# Patient Record
Sex: Female | Born: 1965
Health system: Southern US, Community
[De-identification: ages and names within clinical notes are randomized; demographics above are authoritative.]

## PROBLEM LIST (undated history)

## (undated) DIAGNOSIS — N842 Polyp of vagina: Secondary | ICD-10-CM

## (undated) DIAGNOSIS — M899 Disorder of bone, unspecified: Secondary | ICD-10-CM

## (undated) DIAGNOSIS — N952 Postmenopausal atrophic vaginitis: Secondary | ICD-10-CM

## (undated) DIAGNOSIS — N951 Menopausal and female climacteric states: Secondary | ICD-10-CM

## (undated) DIAGNOSIS — E7800 Pure hypercholesterolemia, unspecified: Secondary | ICD-10-CM

## (undated) DIAGNOSIS — M949 Disorder of cartilage, unspecified: Secondary | ICD-10-CM

## (undated) HISTORY — DX: Disorder of bone, unspecified: M89.9

## (undated) HISTORY — DX: Menopausal and female climacteric states: N95.1

## (undated) HISTORY — PX: OTHER SURGICAL HISTORY: SHX169

## (undated) HISTORY — DX: Pure hypercholesterolemia, unspecified: E78.00

## (undated) HISTORY — DX: Polyp of vagina: N84.2

## (undated) HISTORY — DX: Disorder of cartilage, unspecified: M94.9

## (undated) HISTORY — DX: Postmenopausal atrophic vaginitis: N95.2

---

## 2006-01-01 ENCOUNTER — Encounter: Payer: Self-pay | Admitting: Family Medicine

## 2006-11-11 LAB — CONVERTED CEMR LAB: Pap Smear: NORMAL

## 2007-12-02 ENCOUNTER — Telehealth: Payer: Self-pay | Admitting: Family Medicine

## 2007-12-02 ENCOUNTER — Ambulatory Visit: Payer: Self-pay | Admitting: Family Medicine

## 2007-12-02 DIAGNOSIS — M8580 Other specified disorders of bone density and structure, unspecified site: Secondary | ICD-10-CM

## 2007-12-02 DIAGNOSIS — N842 Polyp of vagina: Secondary | ICD-10-CM

## 2007-12-02 DIAGNOSIS — E7800 Pure hypercholesterolemia, unspecified: Secondary | ICD-10-CM

## 2007-12-02 DIAGNOSIS — N952 Postmenopausal atrophic vaginitis: Secondary | ICD-10-CM

## 2007-12-02 DIAGNOSIS — N951 Menopausal and female climacteric states: Secondary | ICD-10-CM | POA: Insufficient documentation

## 2007-12-09 ENCOUNTER — Ambulatory Visit: Payer: Self-pay | Admitting: Family Medicine

## 2007-12-12 ENCOUNTER — Encounter: Payer: Self-pay | Admitting: Family Medicine

## 2007-12-12 ENCOUNTER — Other Ambulatory Visit: Admission: RE | Admit: 2007-12-12 | Discharge: 2007-12-12 | Payer: Self-pay | Admitting: Family Medicine

## 2007-12-12 ENCOUNTER — Ambulatory Visit: Payer: Self-pay | Admitting: Family Medicine

## 2007-12-12 LAB — CONVERTED CEMR LAB
Alkaline Phosphatase: 30 units/L — ABNORMAL LOW (ref 39–117)
BUN: 23 mg/dL (ref 6–23)
Bilirubin, Direct: 0.1 mg/dL (ref 0.0–0.3)
Chloride: 104 meq/L (ref 96–112)
GFR calc Af Amer: 88 mL/min
GFR calc non Af Amer: 73 mL/min
Glucose, Bld: 87 mg/dL (ref 70–99)
HDL: 77.7 mg/dL (ref >39.0)
Total Bilirubin: 0.7 mg/dL (ref 0.3–1.2)
Total CHOL/HDL Ratio: 3.1

## 2007-12-15 ENCOUNTER — Encounter (INDEPENDENT_AMBULATORY_CARE_PROVIDER_SITE_OTHER): Payer: Self-pay | Admitting: *Deleted

## 2007-12-17 ENCOUNTER — Encounter (INDEPENDENT_AMBULATORY_CARE_PROVIDER_SITE_OTHER): Payer: Self-pay | Admitting: *Deleted

## 2008-01-06 ENCOUNTER — Encounter: Admission: RE | Admit: 2008-01-06 | Discharge: 2008-01-06 | Payer: Self-pay | Admitting: Family Medicine

## 2008-01-06 ENCOUNTER — Encounter: Payer: Self-pay | Admitting: Family Medicine

## 2008-01-14 ENCOUNTER — Encounter: Admission: RE | Admit: 2008-01-14 | Discharge: 2008-01-14 | Payer: Self-pay | Admitting: Family Medicine

## 2008-01-14 ENCOUNTER — Encounter (INDEPENDENT_AMBULATORY_CARE_PROVIDER_SITE_OTHER): Payer: Self-pay | Admitting: *Deleted

## 2008-03-30 ENCOUNTER — Ambulatory Visit: Payer: Self-pay | Admitting: Family Medicine

## 2008-10-18 ENCOUNTER — Telehealth: Payer: Self-pay | Admitting: Family Medicine

## 2009-03-04 ENCOUNTER — Other Ambulatory Visit: Admission: RE | Admit: 2009-03-04 | Discharge: 2009-03-04 | Payer: Self-pay | Admitting: Family Medicine

## 2009-03-04 ENCOUNTER — Ambulatory Visit: Payer: Self-pay | Admitting: Family Medicine

## 2009-03-08 LAB — CONVERTED CEMR LAB: Pap Smear: NEGATIVE

## 2009-03-09 ENCOUNTER — Encounter: Payer: Self-pay | Admitting: Family Medicine

## 2009-03-15 ENCOUNTER — Encounter: Admission: RE | Admit: 2009-03-15 | Discharge: 2009-03-15 | Payer: Self-pay | Admitting: Family Medicine

## 2009-03-17 ENCOUNTER — Ambulatory Visit: Payer: Self-pay | Admitting: Family Medicine

## 2009-03-21 ENCOUNTER — Encounter (INDEPENDENT_AMBULATORY_CARE_PROVIDER_SITE_OTHER): Payer: Self-pay | Admitting: *Deleted

## 2009-03-23 LAB — CONVERTED CEMR LAB
ALT: 22 units/L (ref 0–35)
Alkaline Phosphatase: 34 units/L — ABNORMAL LOW (ref 39–117)
Bilirubin, Direct: 0.1 mg/dL (ref 0.0–0.3)
CO2: 30 meq/L (ref 19–32)
Calcium: 9.5 mg/dL (ref 8.4–10.5)
Chloride: 106 meq/L (ref 96–112)
Creatinine, Ser: 0.8 mg/dL (ref 0.4–1.2)
GFR calc non Af Amer: 82.76 mL/min (ref >60)
Glucose, Bld: 90 mg/dL (ref 70–99)
HDL: 109.6 mg/dL (ref >39.00)
Potassium: 3.9 meq/L (ref 3.5–5.1)
Total Bilirubin: 0.6 mg/dL (ref 0.3–1.2)
Total Protein: 7.4 g/dL (ref 6.0–8.3)
Triglycerides: 69 mg/dL (ref 0.0–149.0)

## 2009-09-02 ENCOUNTER — Ambulatory Visit: Payer: Self-pay | Admitting: Family Medicine

## 2009-09-06 LAB — CONVERTED CEMR LAB
HDL: 83.3 mg/dL (ref >39.00)
Total CHOL/HDL Ratio: 3
Triglycerides: 96 mg/dL (ref 0.0–149.0)
VLDL: 19.2 mg/dL (ref 0.0–40.0)

## 2010-02-17 ENCOUNTER — Telehealth: Payer: Self-pay | Admitting: Family Medicine

## 2010-02-19 ENCOUNTER — Encounter: Payer: Self-pay | Admitting: Family Medicine

## 2010-02-28 NOTE — Assessment & Plan Note (Signed)
Summary: CPX/CLE   Vital Signs:  Patient profile:   45 year old female Weight:      122.25 pounds BMI:     20.90 Temp:     98.3 degrees F oral Pulse rate:   64 / minute Pulse rhythm:   regular BP sitting:   100 / 70  (left arm) Cuff size:   regular  Vitals Entered By: Linde Gillis CMA Duncan Dull) (March 04, 2009 11:26 AM) CC: 30 minute exam   History of Present Illness: No breast lesions, no vaginal discharge/itching Doing well no acute concerns  Has not been exercsiing as much in alst 2 months...so weight gain.  Problems Prior to Update: 1)  Oth General Medical Examination Admin Purposes  (ICD-V70.3) 2)  Mammogram, Abnormal, Right  (ICD-793.80) 3)  Routine Gynecological Examination  (ICD-V72.31) 4)  Well Woman  (ICD-V70.0) 5)  Other Screening Mammogram  (ICD-V76.12) 6)  Hypercholesterolemia  (ICD-272.0) 7)  Osteopenia  (ICD-733.90) 8)  Vaginal Polyp  (ICD-623.7) 9)  Vaginitis, Atrophic  (ICD-627.3) 10)  Menopause, Early  (ICD-627.2)  Current Medications (verified): 1)  Alendronate Sodium 70 Mg Tabs (Alendronate Sodium) .... Take One Tablet By Mouth Each Week. 2)  Premarin 0.625 Mg/gm Crea (Estrogens, Conjugated) .... Take 1 Gram Applicatorful Per Vagina 2-3 Times Per Week. 3)  Eql Allergy Relief 10 Mg Tbdp (Loratadine) .... Take 1 Tablet By Mouth Once A Day As Needed 4)  Multivitamins   Tabs (Multiple Vitamin) .... Take 1 Tablet By Mouth Once A Day 5)  Calcium Carbonate-Vitamin D 600-400 Mg-Unit  Tabs (Calcium Carbonate-Vitamin D) .... Take 1 Tablet By Mouth Two Times A Day  Allergies: 1)  ! Erythromycin 2)  ! * Minocin  Past History:  Past medical, surgical, family and social histories (including risk factors) reviewed, and no changes noted (except as noted below).  Past Medical History: Reviewed history from 12/02/2007 and no changes required. Current Problems:  HYPERCHOLESTEROLEMIA (ICD-272.0) OSTEOPENIA (ICD-733.90) VAGINAL POLYP (ICD-623.7) VAGINITIS,  ATROPHIC (ICD-627.3) MENOPAUSE, EARLY (ICD-627.2)  Past Surgical History: Reviewed history from 12/02/2007 and no changes required. vaginal polyp removal NSVD x 2   Family History: Reviewed history from 12/02/2007 and no changes required. sister: breast cancer age 47 father: prostate cancer mother: lung cancer MGF: CAD, high cholesterol?  Social History: Reviewed history from 12/02/2007 and no changes required. 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 CV:  Denies chest pain or discomfort. Resp:  Denies shortness of breath. GI:  Denies abdominal pain. GU:  Denies abnormal vaginal bleeding and dysuria. Derm:  Denies lesion(s). Psych:  Denies anxiety and depression.  Physical Exam  General:  Well-developed,well-nourished,in no acute distress; alert,appropriate and cooperative throughout examination Eyes:  No corneal or conjunctival inflammation noted. EOMI. Perrla. Funduscopic exam benign, without hemorrhages, exudates or papilledema. Vision grossly normal. Ears:  External ear exam shows no significant lesions or deformities.  Otoscopic examination reveals clear canals, tympanic membranes are intact bilaterally without bulging, retraction, inflammation or discharge. Hearing is grossly normal bilaterally. Nose:  External nasal examination shows no deformity or inflammation. Nasal mucosa are pink and moist without lesions or exudates. Mouth:  Oral mucosa and oropharynx without lesions or  exudates.  Teeth in good repair. Abdomen:  Bowel sounds positive,abdomen soft and non-tender without masses, organomegaly or hernias noted. Genitalia:  Pelvic Exam:        External: normal  female genitalia without lesions or masses        Vagina: normal without lesions or masses        Cervix: normal without lesions or masses        Adnexa: normal bimanual exam without masses or fullness        Uterus: normal by palpation        Pap smear: performed Msk:  No deformity or scoliosis noted of thoracic or lumbar spine.   Pulses:  R and L posterior tibial pulses are full and equal bilaterally  Extremities:  no edema  Skin:  Intact without suspicious lesions or rashes Psych:  Cognition and judgment appear intact. Alert and cooperative with normal attention span and concentration. No apparent delusions, illusions, hallucinations   Impression & Recommendations:  Problem # 1:  WELL WOMAN (ICD-V70.0) Reviewed preventive care protocols, scheduled due services, and updated immunizations. Encouraged exercise, weight loss, healthy eating habits.   Problem # 2:  ROUTINE GYNECOLOGICAL EXAMINATION (ICD-V72.31) PAP pending.   Problem # 3:  OSTEOPENIA (ICD-733.90) next year due for DXA. Contrinue alendronate Her updated medication list for this problem includes:    Alendronate Sodium 70 Mg Tabs (Alendronate sodium) .Marland Kitchen... Take one tablet by mouth each week.    Calcium Carbonate-vitamin D 600-400 Mg-unit Tabs (Calcium carbonate-vitamin d) .Marland Kitchen... Take 1 tablet by mouth two times a day  Problem # 4:  MENOPAUSE, EARLY (ICD-627.2) Stable..vaginitis controlled on low dose premarin.  Her updated medication list for this problem includes:    Premarin 0.625 Mg/gm Crea (Estrogens, conjugated) .Marland Kitchen... Take 1 gram applicatorful per vagina 2-3 times per week.  Complete Medication List: 1)  Alendronate Sodium 70 Mg Tabs (Alendronate sodium) .... Take one tablet by mouth each week. 2)  Premarin 0.625 Mg/gm Crea (Estrogens, conjugated) .... Take 1 gram applicatorful per vagina 2-3 times per week. 3)  Eql Allergy Relief 10 Mg Tbdp (Loratadine) .... Take 1 tablet by mouth once a day as needed 4)   Multivitamins Tabs (Multiple vitamin) .... Take 1 tablet by mouth once a day 5)  Calcium Carbonate-vitamin D 600-400 Mg-unit Tabs (Calcium carbonate-vitamin d) .... Take 1 tablet by mouth two times a day  Other Orders: Radiology Referral (Radiology)  Patient Instructions: 1)  Fasting lipid, CMET Dx v77.91 2)  Referral Appointment Information 3)  Day/Date: 4)  Time: 5)  Place/MD: 6)  Address: 7)  Phone/Fax: 8)  Patient given appointment information. Information/Orders faxed/mailed.  Prescriptions: PREMARIN 0.625 MG/GM CREA (ESTROGENS, CONJUGATED) Take 1 gram applicatorful per vagina 2-3 times per week.  #43 Gram x 3   Entered and Authorized by:   Kerby Nora MD   Signed by:   Kerby Nora MD on 03/04/2009   Method used:   Electronically to        [facility] (retail)       [address]       [place], [place]  [address]       Ph: [phone]       Fax: [phone]   RxID:   [id number] ALENDRONATE SODIUM 70 MG TABS (ALENDRONATE SODIUM) Take one tablet by mouth each week.  #12 x 3   Entered and Authorized by:   Kerby Nora MD   Signed  by:   Kerby Nora MD on 03/04/2009   Method used:   Electronically to        [facility] (retail)       [address]       [place], [place]  [address]       Ph: [phone]       Fax: [phone]   RxID:   [id number]   Current Allergies (reviewed today): ! ERYTHROMYCIN ! * MINOCIN  Flu Vaccine Next Due:  Refused    Past Medical History:    Reviewed history from 12/02/2007 and no changes required:       Current Problems:        HYPERCHOLESTEROLEMIA (ICD-272.0)       OSTEOPENIA (ICD-733.90)       VAGINAL POLYP (ICD-623.7)       VAGINITIS, ATROPHIC (ICD-627.3)       MENOPAUSE, EARLY (ICD-627.2)         Past Surgical History:    Reviewed history from 12/02/2007 and no changes required:       vaginal polyp removal       NSVD x 2

## 2010-02-28 NOTE — Letter (Signed)
Summary: Results Follow up Letter  [place] [facility]  [address] [place], [place] [address]   Phone: [phone]  Fax: [phone]    03/09/2009 MRN: [id number]  [facility] [address] [place], [place]  [address]  Dear Ms. [facility],  The following are the results of your recent test(s):  Test         Result    Pap Smear:        Normal __X___  Not Normal _____ Comments: Repeat in one year. ______________________________________________________ Cholesterol: LDL(Bad cholesterol):         Your goal is less than:         HDL (Good cholesterol):       Your goal is more than: Comments:  ______________________________________________________ Mammogram:        Normal _____  Not Normal _____ Comments:  ___________________________________________________________________ Hemoccult:        Normal _____  Not normal _______ Comments:    _____________________________________________________________________ Other Tests:    We routinely do not discuss normal results over the telephone.  If you desire a copy of the results, or you have any questions about this information we can discuss them at your next office visit.   Sincerely,        Kerby Nora, MD

## 2010-02-28 NOTE — Letter (Signed)
Summary: Generic Letter  [place] at [facility]  [address] [place], [place] [address]   Phone: [phone]  Fax: [phone]    03/21/2009     [facility] [address] [place], [place]  [address]    Dear Ms. Corzine,  I have tried to reach you but could not leave a message because voicemail not set up. Dr. Ermalene Searing says Notify pt that bad chol is elevated LDL goal is at least less than 130, triglyerides are good and HDL is high which is good...mail info on diet, work on exerccise and weight loss. Recheck fasting lipids in 3 months Dx 272.0   ..if not at goal in that lab check consider medication to treat.    Sincerely,   Kerby Nora MD

## 2010-03-02 NOTE — Progress Notes (Signed)
Summary: vagefim, premarin  Phone Note Call from Patient Call back at Home Phone [phone]   Caller: Patient Call For: Kerby Nora MD Summary of Call: Patient received letter from insurance company stating tha vagefim is on the formulary list. She says that it will save her alot of money if she could use the vagefim instead of the premarin.  Uses [facility] on [address]  Initial call taken by: Melody Comas,  February 17, 2010 1:44 PM    New/Updated Medications: VAGIFEM 10 MCG TABS (ESTRADIOL) 10 micrograms PV 2x/week Prescriptions: VAGIFEM 10 MCG TABS (ESTRADIOL) 10 micrograms PV 2x/week  #1 box x 5   Entered and Authorized by:   Kerby Nora MD   Signed by:   Kerby Nora MD on 02/17/2010   Method used:   Electronically to        [facility] (retail)       [address]       [place], [place]  [address]       Ph: [phone]       Fax: [phone]   RxID:   [id number]

## 2010-03-15 ENCOUNTER — Telehealth (INDEPENDENT_AMBULATORY_CARE_PROVIDER_SITE_OTHER): Payer: Self-pay | Admitting: *Deleted

## 2010-03-20 ENCOUNTER — Other Ambulatory Visit: Payer: Self-pay

## 2010-03-20 ENCOUNTER — Encounter (INDEPENDENT_AMBULATORY_CARE_PROVIDER_SITE_OTHER): Payer: Self-pay | Admitting: *Deleted

## 2010-03-20 ENCOUNTER — Other Ambulatory Visit: Payer: Self-pay | Admitting: Family Medicine

## 2010-03-20 ENCOUNTER — Other Ambulatory Visit (INDEPENDENT_AMBULATORY_CARE_PROVIDER_SITE_OTHER): Payer: BC Managed Care – PPO

## 2010-03-20 DIAGNOSIS — E785 Hyperlipidemia, unspecified: Secondary | ICD-10-CM

## 2010-03-20 DIAGNOSIS — E7800 Pure hypercholesterolemia, unspecified: Secondary | ICD-10-CM

## 2010-03-20 LAB — HEPATIC FUNCTION PANEL
ALT: 19 U/L (ref 0–35)
Albumin: 4.6 g/dL (ref 3.5–5.2)
Bilirubin, Direct: 0.1 mg/dL (ref 0.0–0.3)

## 2010-03-20 LAB — LIPID PANEL
Cholesterol: 268 mg/dL — ABNORMAL HIGH (ref 0–200)
HDL: 91 mg/dL (ref >39.00)
Total CHOL/HDL Ratio: 3
VLDL: 25.2 mg/dL (ref 0.0–40.0)

## 2010-03-20 LAB — BASIC METABOLIC PANEL
BUN: 16 mg/dL (ref 6–23)
CO2: 30 mEq/L (ref 19–32)
Calcium: 9.8 mg/dL (ref 8.4–10.5)
Creatinine, Ser: 0.7 mg/dL (ref 0.4–1.2)
Sodium: 142 mEq/L (ref 135–145)

## 2010-03-22 NOTE — Progress Notes (Signed)
----   Converted from flag ---- ---- 03/14/2010 2:04 PM, Kerby Nora MD wrote: CMET, lipids Dx 272.0  ---- 03/14/2010 10:17 AM, Liane Comber CMA (AAMA) wrote: Lab orders please! Good Morning! This pt is scheduled for cpx labs Monday, which labs to draw and dx codes to use? Thanks Tasha ------------------------------

## 2010-03-27 ENCOUNTER — Encounter: Payer: Self-pay | Admitting: Family Medicine

## 2010-03-28 ENCOUNTER — Encounter (INDEPENDENT_AMBULATORY_CARE_PROVIDER_SITE_OTHER): Payer: BC Managed Care – PPO | Admitting: Family Medicine

## 2010-03-28 ENCOUNTER — Encounter: Payer: Self-pay | Admitting: Family Medicine

## 2010-03-28 DIAGNOSIS — S838X9A Sprain of other specified parts of unspecified knee, initial encounter: Secondary | ICD-10-CM | POA: Insufficient documentation

## 2010-03-28 DIAGNOSIS — S86819A Strain of other muscle(s) and tendon(s) at lower leg level, unspecified leg, initial encounter: Secondary | ICD-10-CM

## 2010-03-28 DIAGNOSIS — Z01419 Encounter for gynecological examination (general) (routine) without abnormal findings: Secondary | ICD-10-CM

## 2010-03-28 DIAGNOSIS — Z0000 Encounter for general adult medical examination without abnormal findings: Secondary | ICD-10-CM

## 2010-03-28 LAB — HM PAP SMEAR

## 2010-03-30 ENCOUNTER — Other Ambulatory Visit: Payer: Self-pay | Admitting: Family Medicine

## 2010-03-30 DIAGNOSIS — M8580 Other specified disorders of bone density and structure, unspecified site: Secondary | ICD-10-CM

## 2010-03-30 DIAGNOSIS — Z1231 Encounter for screening mammogram for malignant neoplasm of breast: Secondary | ICD-10-CM

## 2010-04-06 NOTE — Assessment & Plan Note (Signed)
Summary: CPX/CLE   Vital Signs:  Patient profile:   45 year old female Height:      64.25 inches Weight:      124 pounds BMI:     21.20 Temp:     98.8 degrees F oral Pulse rate:   64 / minute Pulse rhythm:   regular BP sitting:   100 / 60  (left arm) Cuff size:   regular  Vitals Entered By: Benny Lennert CMA [facility] Dull) (March 28, 2010 3:44 PM)  History of Present Illness: Chief complaint cpx with out pap had 3  normals  The patient is here for annual wellness exam and preventative care.    She has the following acute and chronic issues.  High  cholesterol   Early menopause and osteopenia: Now on vagifem  for last month and alendronate.  Was on premarin cream for vaginal driness... was having some vaginal itchingbut this was more affordable for her. Wants to come off and try OTC lubricant.   Last DEXA 2010   Last June 2011 pulled uscle in right hamstring after playing tag with kids. Stopped exercsiing for 1 month.. when she restarted squats.. she reinjured leg. Also tripped again in 10/2009. Has not exercsied regualrly due to this. Feels sensitivity in right posterior thigh...not really pain, notes most up hill. But is nervous to return to exercise. Not doing any stretching or rehab in area.   12/2009 hit right 5th digit.. ? broken.. contusion. Did buddy taping, ice.  Resolved.   Ringing in ears B since exposed to high frequency rifle 1 week ago. No pain. No hearing loss.    Problems Prior to Update: 1)  Oth General Medical Examination Admin Purposes  (ICD-V70.3) 2)  Routine Gynecological Examination  (ICD-V72.31) 3)  Well Woman  (ICD-V70.0) 4)  Other Screening Mammogram  (ICD-V76.12) 5)  Hypercholesterolemia  (ICD-272.0) 6)  Osteopenia  (ICD-733.90) 7)  Vaginal Polyp  (ICD-623.7) 8)  Vaginitis, Atrophic  (ICD-627.3) 9)  Menopause, Early  (ICD-627.2)  Current Medications (verified): 1)  Alendronate Sodium 70 Mg Tabs (Alendronate Sodium) .... Take One Tablet  By Mouth Each Week. 2)  Vagifem 10 Mcg Tabs (Estradiol) .Marland Kitchen.. 10 Micrograms Pv 2x/week 3)  Eql Allergy Relief 10 Mg Tbdp (Loratadine) .... Take 1 Tablet By Mouth Once A Day As Needed 4)  Multivitamins   Tabs (Multiple Vitamin) .... Take 1 Tablet By Mouth Once A Day 5)  Calcium Carbonate-Vitamin D 600-400 Mg-Unit  Tabs (Calcium Carbonate-Vitamin D) .... Take 1 Tablet By Mouth Two Times A Day  Allergies: 1)  ! Erythromycin 2)  ! * Minocin  Past History:  Past medical, surgical, family and social histories (including risk factors) reviewed, and no changes noted (except as noted below).  Past Medical History: Reviewed history from 12/02/2007 and no changes required. Current Problems:  HYPERCHOLESTEROLEMIA (ICD-272.0) OSTEOPENIA (ICD-733.90) VAGINAL POLYP (ICD-623.7) VAGINITIS, ATROPHIC (ICD-627.3) MENOPAUSE, EARLY (ICD-627.2)  Past Surgical History: Reviewed history from 12/02/2007 and no changes required. vaginal polyp removal NSVD x 2   Family History: Reviewed history from 12/02/2007 and no changes required. sister: breast cancer age 65 father: prostate cancer mother: lung cancer MGF: CAD, high cholesterol?  Social History: Reviewed history from 12/02/2007 and no changes required. 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 CV:  Denies chest pain or discomfort. Resp:  Denies shortness of breath. GI:  Denies abdominal pain, bloody stools, constipation, and diarrhea. GU:  Denies abnormal vaginal bleeding and dysuria. Derm:  Denies lesion(s). Psych:  Denies anxiety and depression.  Physical Exam  General:  Well-developed,well-nourished,in no acute distress; alert,appropriate and cooperative throughout examination Eyes:   No corneal or conjunctival inflammation noted. EOMI. Perrla. Funduscopic exam benign, without hemorrhages, exudates or papilledema. Vision grossly normal. Ears:  External ear exam shows no significant lesions or deformities.  Otoscopic examination reveals clear canals, tympanic membranes are intact bilaterally without bulging, retraction, inflammation or discharge. Hearing is grossly normal bilaterally. Nose:  External nasal examination shows no deformity or inflammation. Nasal mucosa are pink and moist without lesions or exudates. Mouth:  Oral mucosa and oropharynx without lesions or exudates.  Teeth in good repair. Neck:  no carotid bruit or thyromegaly no cervical or supraclavicular lymphadenopathy  Lungs:  Normal respiratory effort, chest expands symmetrically. Lungs are clear to auscultation, no crackles or wheezes. Heart:  Normal rate and regular rhythm. S1 and S2 normal without gallop, murmur, click, rub or other extra sounds. Abdomen:  Bowel sounds positive,abdomen soft and non-tender without masses, organomegaly or hernias noted. Genitalia:  Pelvic Exam:        External: normal female genitalia without lesions or masses        Vagina: normal without lesions or masses        Cervix: normal without lesions or masses        Adnexa: normal bimanual exam without masses or fullness        Uterus: normal by palpation        Pap smear: not performed Msk:  mild ttp over right hamstring, minimal pain, full ROM of hip and knee Pulses:  R and L posterior tibial pulses are full and equal bilaterally  Extremities:  no edema  Skin:  Intact without suspicious lesions or rashes Psych:  Cognition and judgment appear intact. Alert and cooperative with normal attention span and concentration. No apparent delusions, illusions, hallucinations   Impression & Recommendations:  Problem # 1:  WELL WOMAN (ICD-V70.0) The patient's preventative maintenance and recommended screening tests for an annual wellness  exam were reviewed in full today. Brought up to date unless services declined.  Counselled on the importance of diet, exercise, and its role in overall health and mortality. The patient's FH and SH was reviewed, including their home life, tobacco status, and drug and alcohol status.     Problem # 2:  ROUTINE GYNECOLOGICAL EXAMINATION (ICD-V72.31)  DVE nml, No PAP.Marland Kitchen q2-3 years  Problem # 3:  HYPERCHOLESTEROLEMIA (ICD-272.0) Assessment: Deteriorated Encouraged exercise, weight loss, healthy eating habits. Recheck fasting LIPIDS,  in 3 months Dx 272.0     Problem # 4:  VAGINITIS, ATROPHIC (ICD-627.3) Okay to try OTC meds.Marland Kitchen discussed options.  Her updated medication list for this problem includes:    Vagifem 10 Mcg Tabs (Estradiol) .Marland KitchenMarland KitchenMarland KitchenMarland Kitchen 10 micrograms pv 2x/week  Problem # 5:  MUSCLE STRAIN, HAMSTRING MUSCLE (ICD-844.8) Rehabilitation exercsies given . Work back into exercsie slowly.   Complete Medication List: 1)  Alendronate Sodium 70 Mg Tabs (Alendronate sodium) .... Take one tablet by mouth each week. 2)  Vagifem 10 Mcg Tabs (Estradiol) .Marland Kitchen.. 10 micrograms pv 2x/week 3)  Eql Allergy Relief 10 Mg Tbdp (Loratadine) .... Take 1 tablet by mouth once a day as needed 4)  Multivitamins Tabs (Multiple vitamin) .... Take 1 tablet by mouth  once a day 5)  Calcium Carbonate-vitamin D 600-400 Mg-unit Tabs (Calcium carbonate-vitamin d) .... Take 1 tablet by mouth two times a day  Other Orders: Radiology Referral (Radiology)  Patient Instructions: 1)  Referral Appointment Information 2)  Day/Date: 3)  Time: 4)  Place/MD: 5)  Address: 6)  Phone/Fax: 7)  Patient given appointment information. Information/Orders faxed/mailed.  8)  Start with hamstring rehabilitation.Marland Kitchen get back to walking. 9)  Work on low fats foods, less processed food. 10)  Recheck fasting LIPIDS,  in 3 months Dx 272.0    11)  Please schedule a follow-up appointment in 1 year.    Orders Added: 1)  Radiology Referral  [Radiology] 2)  Est. Patient 40-64 years [phone]    Current Allergies (reviewed today): ! ERYTHROMYCIN ! * MINOCIN  Flu Vaccine Next Due:  Refused Last PAP:  NEGATIVE FOR INTRAEPITHELIAL LESIONS OR MALIGNANCY. (03/04/2009 12:00:00 AM) PAP Result Date:  03/28/2010 PAP Result:  dve nml, pap q2-3 years PAP Next Due:  1 yr

## 2010-04-12 ENCOUNTER — Ambulatory Visit
Admission: RE | Admit: 2010-04-12 | Discharge: 2010-04-12 | Disposition: A | Discharge status: Home / Self Care | Payer: BC Managed Care – PPO | Source: Ambulatory Visit | Attending: Family Medicine | Admitting: Family Medicine

## 2010-04-12 DIAGNOSIS — M8580 Other specified disorders of bone density and structure, unspecified site: Secondary | ICD-10-CM

## 2010-04-12 DIAGNOSIS — Z1231 Encounter for screening mammogram for malignant neoplasm of breast: Secondary | ICD-10-CM

## 2010-04-14 LAB — HM MAMMOGRAPHY

## 2010-07-03 ENCOUNTER — Encounter: Payer: Self-pay | Admitting: Family Medicine

## 2010-07-03 ENCOUNTER — Ambulatory Visit (INDEPENDENT_AMBULATORY_CARE_PROVIDER_SITE_OTHER): Payer: BC Managed Care – PPO | Admitting: Family Medicine

## 2010-07-03 DIAGNOSIS — S90569A Insect bite (nonvenomous), unspecified ankle, initial encounter: Secondary | ICD-10-CM

## 2010-07-03 DIAGNOSIS — W57XXXA Bitten or stung by nonvenomous insect and other nonvenomous arthropods, initial encounter: Secondary | ICD-10-CM

## 2010-07-03 DIAGNOSIS — S80861A Insect bite (nonvenomous), right lower leg, initial encounter: Secondary | ICD-10-CM | POA: Insufficient documentation

## 2010-07-03 NOTE — Assessment & Plan Note (Addendum)
No insect/spider for identification. No red flags.  Minimal current allergic response. Treat with antihistamine and symptoms control.

## 2010-07-03 NOTE — Progress Notes (Signed)
  Subjective:    Patient ID: Stephanie Dawson, female    DOB: [birth year], 45 y.o.   MRN: [id number]  HPI  45 year old female presents with spider bite. Witnesses spider bite her outside this AM. Tiny  brownish spider hard to identify. Felt like a sting, immediate redness and swelling. Redness is lessened now but still painful at site.  No other symptoms, no SOB, no other rash, no CP, no difficulty swallowing or lip swelling.     Review of Systems    See HPI, pertinant negative Objective:   Physical Exam  Constitutional: She appears well-developed and well-nourished.  HENT:  Head: Normocephalic.  Mouth/Throat: Oropharynx is clear and moist.  Cardiovascular: Normal rate, regular rhythm, normal heart sounds and intact distal pulses.   Pulmonary/Chest: Effort normal and breath sounds normal. No respiratory distress. She has no wheezes.  Skin:       1 or possibly 2 tiny puncture marks on right lower leg, very mild surrounding erythema, no swelling          Assessment & Plan:

## 2010-07-03 NOTE — Patient Instructions (Signed)
Recommend oral antihistamine today and possibly tommorow like Claritin or zyrtec. Can apply ice to area, meat tenderizer or topical antihistamine to bite area to help with pain. Call if redness spreading up leg, pus drainage, fever or other signs of infection from site over next few weeks. Go to ER if shortness of breath or difficulty swallowing.

## 2011-01-26 ENCOUNTER — Other Ambulatory Visit: Payer: Self-pay | Admitting: Family Medicine

## 2011-07-25 ENCOUNTER — Telehealth: Payer: Self-pay | Admitting: Family Medicine

## 2011-07-25 DIAGNOSIS — E7800 Pure hypercholesterolemia, unspecified: Secondary | ICD-10-CM

## 2011-07-25 DIAGNOSIS — M949 Disorder of cartilage, unspecified: Secondary | ICD-10-CM

## 2011-07-25 NOTE — Telephone Encounter (Signed)
Message copied by Excell Seltzer on Wed Jul 25, 2011 11:48 PM ------      Message from: Stephanie Dawson      Created: Wed Jul 25, 2011  9:35 AM      Regarding: Cpx labs Mon 7/1       Please order  future cpx labs for pt's upcomming lab appt.      Thanks      Rodney Booze

## 2011-07-30 ENCOUNTER — Other Ambulatory Visit (INDEPENDENT_AMBULATORY_CARE_PROVIDER_SITE_OTHER): Payer: BC Managed Care – PPO

## 2011-07-30 DIAGNOSIS — E7800 Pure hypercholesterolemia, unspecified: Secondary | ICD-10-CM

## 2011-07-30 DIAGNOSIS — M899 Disorder of bone, unspecified: Secondary | ICD-10-CM

## 2011-07-30 LAB — COMPREHENSIVE METABOLIC PANEL
ALT: 16 U/L (ref 0–35)
Albumin: 4.8 g/dL (ref 3.5–5.2)
Alkaline Phosphatase: 35 U/L — ABNORMAL LOW (ref 39–117)
Calcium: 10.1 mg/dL (ref 8.4–10.5)
Creatinine, Ser: 0.8 mg/dL (ref 0.4–1.2)
Glucose, Bld: 97 mg/dL (ref 70–99)
Sodium: 141 mEq/L (ref 135–145)
Total Bilirubin: 0.7 mg/dL (ref 0.3–1.2)

## 2011-07-30 LAB — LIPID PANEL: VLDL: 31.8 mg/dL (ref 0.0–40.0)

## 2011-07-31 LAB — VITAMIN D 25 HYDROXY (VIT D DEFICIENCY, FRACTURES): Vit D, 25-Hydroxy: 43 ng/mL (ref 30–89)

## 2011-08-03 ENCOUNTER — Encounter: Payer: Self-pay | Admitting: Family Medicine

## 2011-08-03 ENCOUNTER — Ambulatory Visit (INDEPENDENT_AMBULATORY_CARE_PROVIDER_SITE_OTHER): Payer: BC Managed Care – PPO | Admitting: Family Medicine

## 2011-08-03 VITALS — BP 98/62 | HR 56 | Temp 98.6°F | Ht 64.25 in | Wt 127.4 lb

## 2011-08-03 DIAGNOSIS — N951 Menopausal and female climacteric states: Secondary | ICD-10-CM

## 2011-08-03 DIAGNOSIS — Z0000 Encounter for general adult medical examination without abnormal findings: Secondary | ICD-10-CM

## 2011-08-03 DIAGNOSIS — Z1231 Encounter for screening mammogram for malignant neoplasm of breast: Secondary | ICD-10-CM

## 2011-08-03 DIAGNOSIS — M949 Disorder of cartilage, unspecified: Secondary | ICD-10-CM

## 2011-08-03 DIAGNOSIS — M79671 Pain in right foot: Secondary | ICD-10-CM

## 2011-08-03 DIAGNOSIS — E7800 Pure hypercholesterolemia, unspecified: Secondary | ICD-10-CM

## 2011-08-03 DIAGNOSIS — N952 Postmenopausal atrophic vaginitis: Secondary | ICD-10-CM

## 2011-08-03 DIAGNOSIS — M899 Disorder of bone, unspecified: Secondary | ICD-10-CM

## 2011-08-03 DIAGNOSIS — M79609 Pain in unspecified limb: Secondary | ICD-10-CM

## 2011-08-03 NOTE — Assessment & Plan Note (Signed)
No focal bony tenderness, likely deep bruise vs muscle injury. Ice, elevation, NSAIDs, call if not continuing to improve.

## 2011-08-03 NOTE — Patient Instructions (Addendum)
Decrease carbs in diet, less dessert, decrease fried foods. Change cheese to low fat cheese. Change to skim milk.  Fish /flax seed oil 2000 mg divided daily. Stop on way out to set up mamogram. Foot pain: No focal bony tenderness, likely deep bruise vs muscle injury. Ice, elevation, NSAIDs, call if not continuing to improve.

## 2011-08-03 NOTE — Assessment & Plan Note (Signed)
Continue replense.

## 2011-08-03 NOTE — Assessment & Plan Note (Signed)
Trigs high. Discussed diet changes in detail. Add fish oil. Recheck in 1 year.

## 2011-08-03 NOTE — Assessment & Plan Note (Signed)
Using Replense as prescription cream too expensive. Helping fairly well.

## 2011-08-03 NOTE — Progress Notes (Signed)
Subjective:    Patient ID: Stephanie Dawson, female    DOB: July 06, 1965, 46 y.o.   MRN: [id number]  HPI The patient is here for annual wellness exam and preventative care.    She has been feeling well overall.  Noted wood tick on neck in 04/2011. No fever, no rash, no symptoms.  6/11 she dropped cell phone on right dorsal foot. Continue to have soreness in foot with walking, tender to touch, gradually improving.  She does have osteopenia, vit D nml. On fosamax.  Elevated Cholesterol: Lab Results  Component Value Date   CHOL 284* 07/30/2011   HDL 90.50 07/30/2011   LDLDIRECT 160.8 07/30/2011   TRIG 159.0* 07/30/2011   CHOLHDL 3 07/30/2011    Using medications without problems:None Muscle aches: None Diet compliance: yes, vegetarian primarily in last few months, still eats a lot of cheese. Exercise:yes x 3 times a week, except in last two weeks due to foot pain. Other complaints:    Review of Systems  Constitutional: Negative for fever, fatigue and unexpected weight change.  HENT: Negative for ear pain, congestion, sore throat, sneezing, trouble swallowing and sinus pressure.   Eyes: Negative for pain and itching.  Respiratory: Negative for cough, shortness of breath and wheezing.   Cardiovascular: Negative for chest pain, palpitations and leg swelling.  Gastrointestinal: Negative for nausea, abdominal pain, diarrhea, constipation and blood in stool.  Genitourinary: Negative for dysuria, hematuria, vaginal discharge, difficulty urinating and menstrual problem.  Skin: Positive for rash.       Has noted spot on feet ? Wart.   Neurological: Negative for syncope, weakness, light-headedness, numbness and headaches.  Psychiatric/Behavioral: Negative for confusion and dysphoric mood. The patient is not nervous/anxious.        Objective:   Physical Exam  Constitutional: Vital signs are normal. She appears well-developed and well-nourished. She is cooperative.  Non-toxic appearance. She does  not appear ill. No distress.  HENT:  Head: Normocephalic.  Right Ear: Hearing, tympanic membrane, external ear and ear canal normal.  Left Ear: Hearing, tympanic membrane, external ear and ear canal normal.  Nose: Nose normal.  Eyes: Conjunctivae, EOM and lids are normal. Pupils are equal, round, and reactive to light. No foreign bodies found.  Neck: Trachea normal and normal range of motion. Neck supple. Carotid bruit is not present. No mass and no thyromegaly present.  Cardiovascular: Normal rate, regular rhythm, S1 normal, S2 normal, normal heart sounds and intact distal pulses.  Exam reveals no gallop.   No murmur heard. Pulmonary/Chest: Effort normal and breath sounds normal. No respiratory distress. She has no wheezes. She has no rhonchi. She has no rales.  Abdominal: Soft. Normal appearance and bowel sounds are normal. She exhibits no distension, no fluid wave, no abdominal bruit and no mass. There is no hepatosplenomegaly. There is no tenderness. There is no rebound, no guarding and no CVA tenderness. No hernia.  Genitourinary: Vagina normal and uterus normal. No breast swelling, tenderness, discharge or bleeding. Pelvic exam was performed with patient prone. There is no rash, tenderness or lesion on the right labia. There is no rash, tenderness or lesion on the left labia. Uterus is not enlarged and not tender. Right adnexum displays no mass, no tenderness and no fullness. Left adnexum displays no mass, no tenderness and no fullness.  Musculoskeletal:       Right ankle: Normal.       Left ankle: Normal.       Right foot: She exhibits tenderness. She exhibits  normal range of motion, no bony tenderness, no swelling and no deformity.       Left foot: Normal.  Lymphadenopathy:    She has no cervical adenopathy.    She has no axillary adenopathy.  Neurological: She is alert. She has normal strength. No cranial nerve deficit or sensory deficit.  Skin: Skin is warm, dry and intact. No rash  noted.  Psychiatric: Her speech is normal and behavior is normal. Judgment normal. Her mood appears not anxious. Cognition and memory are normal. She does not exhibit a depressed mood.          Assessment & Plan:  The patient's preventative maintenance and recommended screening tests for an annual wellness exam were reviewed in full today. Brought up to date unless services declined.  Counselled on the importance of diet, exercise, and its role in overall health and mortality. The patient's FH and SH was reviewed, including their home life, tobacco status, and drug and alcohol status.   PAP 2011 nml, on q3 year schedule, next due in 2014 , DVE yearly   DEXA: osteopenia 2012  Mammogram, nml 212, due. Vaccnes: Up to date with TD.

## 2011-08-23 ENCOUNTER — Ambulatory Visit
Admission: RE | Admit: 2011-08-23 | Discharge: 2011-08-23 | Disposition: A | Discharge status: Home / Self Care | Payer: BC Managed Care – PPO | Source: Ambulatory Visit | Attending: Family Medicine | Admitting: Family Medicine

## 2011-08-23 DIAGNOSIS — Z1231 Encounter for screening mammogram for malignant neoplasm of breast: Secondary | ICD-10-CM

## 2011-12-25 ENCOUNTER — Other Ambulatory Visit: Payer: Self-pay | Admitting: Family Medicine

## 2012-02-26 ENCOUNTER — Encounter: Payer: Self-pay | Admitting: Family Medicine

## 2012-02-26 ENCOUNTER — Ambulatory Visit: Payer: BC Managed Care – PPO | Admitting: Family Medicine

## 2012-02-26 ENCOUNTER — Ambulatory Visit
Admission: RE | Admit: 2012-02-26 | Discharge: 2012-02-26 | Disposition: A | Discharge status: Home / Self Care | Payer: BC Managed Care – PPO | Source: Ambulatory Visit | Attending: Family Medicine | Admitting: Family Medicine

## 2012-02-26 ENCOUNTER — Ambulatory Visit (INDEPENDENT_AMBULATORY_CARE_PROVIDER_SITE_OTHER): Payer: BC Managed Care – PPO | Admitting: Family Medicine

## 2012-02-26 ENCOUNTER — Ambulatory Visit (INDEPENDENT_AMBULATORY_CARE_PROVIDER_SITE_OTHER)
Admission: RE | Admit: 2012-02-26 | Discharge: 2012-02-26 | Disposition: A | Discharge status: Home / Self Care | Payer: BC Managed Care – PPO | Source: Ambulatory Visit | Attending: Family Medicine | Admitting: Family Medicine

## 2012-02-26 VITALS — BP 110/72 | Temp 98.4°F | Ht 64.25 in | Wt 126.5 lb

## 2012-02-26 DIAGNOSIS — M25531 Pain in right wrist: Secondary | ICD-10-CM

## 2012-02-26 DIAGNOSIS — M25539 Pain in unspecified wrist: Secondary | ICD-10-CM

## 2012-02-26 DIAGNOSIS — M25532 Pain in left wrist: Secondary | ICD-10-CM

## 2012-02-26 NOTE — Addendum Note (Signed)
Addended byKerby Nora E on: 02/26/2012 12:01 PM   Modules accepted: Orders

## 2012-02-26 NOTE — Progress Notes (Signed)
  Subjective:    Patient ID: Stephanie Dawson, female    DOB: 03/13/1965, 47 y.o.   MRN: [id number]  HPI  Correction.. Abnormal exam is on right wrist not left.  Review of Systems     Objective:   Physical Exam        Assessment & Plan:

## 2012-02-26 NOTE — Assessment & Plan Note (Signed)
Given osteopenia and focal ttp after fall,will eval for fracture.

## 2012-02-26 NOTE — Addendum Note (Signed)
Addended by: Alvina Chou on: 02/26/2012 12:17 PM   Modules accepted: Orders

## 2012-02-26 NOTE — Progress Notes (Signed)
  Subjective:    Patient ID: Stephanie Dawson, female    DOB: January 12, 1966, 47 y.o.   MRN: [id number]  HPI 47 year old female  With history of osteopenia and early menopause presents with right wrist pain. 2 weeks ago... Dog ran into her, she fell on outstretches hand. Immediate pain and swelling. Bruising on lateral arm and palmar aspect of wrist. Since then pain has   Able to move hands, thumbs, moving wrist was painful. In last 2 week pain has improved but remains ttp over dorsal wrist.  Mild pain with range of motion. 1-2/10 pain scale.  She has noted some slight deformity dorsally when her hands lay at her side. Does have pain when lift something heavy on thaat side.    She has been wear wrist brace. At first took advil and iced   Review of Systems  Constitutional: Negative for fever and fatigue.  HENT: Negative for ear pain.   Eyes: Negative for pain.  Respiratory: Negative for chest tightness and shortness of breath.   Cardiovascular: Negative for chest pain, palpitations and leg swelling.  Gastrointestinal: Negative for abdominal pain.  Genitourinary: Negative for dysuria.       Objective:   Physical Exam  Constitutional: Vital signs are normal. She appears well-developed and well-nourished. She is cooperative.  Non-toxic appearance. She does not appear ill. No distress.  HENT:  Head: Normocephalic.  Right Ear: Hearing, tympanic membrane, external ear and ear canal normal. Tympanic membrane is not erythematous, not retracted and not bulging.  Left Ear: Hearing, tympanic membrane, external ear and ear canal normal. Tympanic membrane is not erythematous, not retracted and not bulging.  Nose: No mucosal edema or rhinorrhea. Right sinus exhibits no maxillary sinus tenderness and no frontal sinus tenderness. Left sinus exhibits no maxillary sinus tenderness and no frontal sinus tenderness.  Mouth/Throat: Uvula is midline, oropharynx is clear and moist and mucous membranes are normal.    Eyes: Conjunctivae normal, EOM and lids are normal. Pupils are equal, round, and reactive to light. No foreign bodies found.  Neck: Trachea normal and normal range of motion. Neck supple. Carotid bruit is not present. No mass and no thyromegaly present.  Cardiovascular: Normal rate, regular rhythm, S1 normal, S2 normal, normal heart sounds, intact distal pulses and normal pulses.  Exam reveals no gallop and no friction rub.   No murmur heard. Pulmonary/Chest: Effort normal and breath sounds normal. Not tachypneic. No respiratory distress. She has no decreased breath sounds. She has no wheezes. She has no rhonchi. She has no rales.  Abdominal: Soft. Normal appearance and bowel sounds are normal. There is no tenderness.  Musculoskeletal:       bruising on lateral wrist and palmar wrist as well as elbow ttp over dorsal radial head and schaphoid and lunate full ROM, no swelling.  Slight dorsal angulation of left wrist when hanging at sides.  Neurological: She is alert.  Skin: Skin is warm, dry and intact. No rash noted.  Psychiatric: Her speech is normal and behavior is normal. Judgment and thought content normal. Her mood appears not anxious. Cognition and memory are normal. She does not exhibit a depressed mood.          Assessment & Plan:

## 2012-02-26 NOTE — Patient Instructions (Addendum)
We will call you with X-ray results. Can use advil for pain. Can wear brace in right wrist if more comfortable.

## 2012-02-27 ENCOUNTER — Other Ambulatory Visit: Payer: Self-pay | Admitting: Family Medicine

## 2012-02-27 DIAGNOSIS — S52509A Unspecified fracture of the lower end of unspecified radius, initial encounter for closed fracture: Secondary | ICD-10-CM

## 2012-03-17 ENCOUNTER — Other Ambulatory Visit: Payer: Self-pay | Admitting: Family Medicine

## 2012-06-08 ENCOUNTER — Other Ambulatory Visit: Payer: Self-pay | Admitting: Family Medicine

## 2012-07-29 ENCOUNTER — Other Ambulatory Visit: Payer: Self-pay

## 2012-07-29 DIAGNOSIS — Z1231 Encounter for screening mammogram for malignant neoplasm of breast: Secondary | ICD-10-CM

## 2012-08-25 ENCOUNTER — Ambulatory Visit
Admission: RE | Admit: 2012-08-25 | Discharge: 2012-08-25 | Disposition: A | Discharge status: Home / Self Care | Payer: BC Managed Care – PPO | Source: Ambulatory Visit

## 2012-08-25 DIAGNOSIS — Z1231 Encounter for screening mammogram for malignant neoplasm of breast: Secondary | ICD-10-CM

## 2012-08-26 ENCOUNTER — Telehealth: Payer: Self-pay | Admitting: Family Medicine

## 2012-08-26 DIAGNOSIS — M949 Disorder of cartilage, unspecified: Secondary | ICD-10-CM

## 2012-08-26 DIAGNOSIS — E7800 Pure hypercholesterolemia, unspecified: Secondary | ICD-10-CM

## 2012-08-26 NOTE — Telephone Encounter (Signed)
Message copied by Excell Seltzer on Tue Aug 26, 2012  2:09 PM ------      Message from: Alvina Chou      Created: Thu Aug 14, 2012  3:21 PM      Regarding: Lab orders for Wednesday, 7.30.14       Patient is scheduled for CPX labs, please order future labs, Thanks , Terri       ------

## 2012-08-27 ENCOUNTER — Other Ambulatory Visit (INDEPENDENT_AMBULATORY_CARE_PROVIDER_SITE_OTHER): Payer: BC Managed Care – PPO

## 2012-08-27 DIAGNOSIS — E7800 Pure hypercholesterolemia, unspecified: Secondary | ICD-10-CM

## 2012-08-27 DIAGNOSIS — M949 Disorder of cartilage, unspecified: Secondary | ICD-10-CM

## 2012-08-27 LAB — COMPREHENSIVE METABOLIC PANEL
ALT: 16 U/L (ref 0–35)
AST: 21 U/L (ref 0–37)
Albumin: 4.9 g/dL (ref 3.5–5.2)
Alkaline Phosphatase: 28 U/L — ABNORMAL LOW (ref 39–117)
Chloride: 103 mEq/L (ref 96–112)
Potassium: 4.5 mEq/L (ref 3.5–5.1)
Sodium: 140 mEq/L (ref 135–145)
Total Protein: 7.6 g/dL (ref 6.0–8.3)

## 2012-08-27 LAB — LIPID PANEL: Total CHOL/HDL Ratio: 4

## 2012-08-27 LAB — LDL CHOLESTEROL, DIRECT: Direct LDL: 191.3 mg/dL

## 2012-08-28 LAB — VITAMIN D 25 HYDROXY (VIT D DEFICIENCY, FRACTURES): Vit D, 25-Hydroxy: 62 ng/mL (ref 30–89)

## 2012-09-02 ENCOUNTER — Other Ambulatory Visit: Payer: Self-pay | Admitting: Family Medicine

## 2012-09-03 ENCOUNTER — Ambulatory Visit (INDEPENDENT_AMBULATORY_CARE_PROVIDER_SITE_OTHER): Payer: BC Managed Care – PPO | Admitting: Family Medicine

## 2012-09-03 ENCOUNTER — Encounter: Payer: Self-pay | Admitting: Family Medicine

## 2012-09-03 ENCOUNTER — Other Ambulatory Visit (HOSPITAL_COMMUNITY)
Admission: RE | Admit: 2012-09-03 | Discharge: 2012-09-03 | Disposition: A | Discharge status: Home / Self Care | Payer: BC Managed Care – PPO | Source: Ambulatory Visit | Attending: Family Medicine | Admitting: Family Medicine

## 2012-09-03 VITALS — BP 102/62 | HR 68 | Temp 98.2°F | Ht 64.25 in | Wt 118.0 lb

## 2012-09-03 DIAGNOSIS — M899 Disorder of bone, unspecified: Secondary | ICD-10-CM

## 2012-09-03 DIAGNOSIS — Z01419 Encounter for gynecological examination (general) (routine) without abnormal findings: Secondary | ICD-10-CM | POA: Insufficient documentation

## 2012-09-03 DIAGNOSIS — Z0000 Encounter for general adult medical examination without abnormal findings: Secondary | ICD-10-CM

## 2012-09-03 DIAGNOSIS — M949 Disorder of cartilage, unspecified: Secondary | ICD-10-CM

## 2012-09-03 DIAGNOSIS — Z1151 Encounter for screening for human papillomavirus (HPV): Secondary | ICD-10-CM | POA: Insufficient documentation

## 2012-09-03 MED ORDER — ESTRADIOL 10 MCG VA TABS: 1.0000 | ORAL_TABLET | VAGINAL | Status: DC

## 2012-09-03 NOTE — Progress Notes (Signed)
The patient is here for annual wellness exam and preventative care.  She has been feeling well overall.   She does have osteopenia, vit D nml. On fosamax.   Sister with breast cancer at age 48.  Elevated Cholesterol: Started trim healthy Momma diet, low glycemic diet. May have been having more butter. Lab Results  Component Value Date   CHOL 293* 08/27/2012   HDL 81.90 08/27/2012   LDLDIRECT 191.3 08/27/2012   TRIG 85.0 08/27/2012   CHOLHDL 4 08/27/2012   Using medications without problems:None  Muscle aches: None  Diet compliance: yes, vegetarian primarily Exercise:yes x 3 times a week, except in last two weeks due to foot pain.  Other complaints:  Wt Readings from Last 3 Encounters:  09/03/12 118 lb (53.524 kg)  02/26/12 126 lb 8 oz (57.38 kg)  08/03/11 127 lb 6.4 oz (57.788 kg)     Review of Systems  Constitutional: Negative for fever, fatigue and unexpected weight change.  HENT: Negative for ear pain, congestion, sore throat, sneezing, trouble swallowing and sinus pressure.  Eyes: Negative for pain and itching.  Respiratory: Negative for cough, shortness of breath and wheezing.  Cardiovascular: Negative for chest pain, palpitations and leg swelling.  Gastrointestinal: Negative for nausea, abdominal pain, diarrhea, constipation and blood in stool.  Genitourinary: Negative for dysuria, hematuria, vaginal discharge, difficulty urinating and menstrual problem.  Skin: No rash. Neurological: Negative for syncope, weakness, light-headedness, numbness and headaches.  Psychiatric/Behavioral: Negative for confusion and dysphoric mood. The patient is not nervous/anxious.  Objective:   Physical Exam  Constitutional: Vital signs are normal. She appears well-developed and well-nourished. She is cooperative. Non-toxic appearance. She does not appear ill. No distress.  HENT:  Head: Normocephalic.  Right Ear: Hearing, tympanic membrane, external ear and ear canal normal.  Left Ear: Hearing,  tympanic membrane, external ear and ear canal normal.  Nose: Nose normal.  Eyes: Conjunctivae, EOM and lids are normal. Pupils are equal, round, and reactive to light. No foreign bodies found.  Neck: Trachea normal and normal range of motion. Neck supple. Carotid bruit is not present. No mass and no thyromegaly present.  Cardiovascular: Normal rate, regular rhythm, S1 normal, S2 normal, normal heart sounds and intact distal pulses. Exam reveals no gallop.  No murmur heard.  Pulmonary/Chest: Effort normal and breath sounds normal. No respiratory distress. She has no wheezes. She has no rhonchi. She has no rales.  Abdominal: Soft. Normal appearance and bowel sounds are normal. She exhibits no distension, no fluid wave, no abdominal bruit and no mass. There is no hepatosplenomegaly. There is no tenderness. There is no rebound, no guarding and no CVA tenderness. No hernia.  Genitourinary: Vagina normal and uterus normal. No breast swelling, tenderness, discharge or bleeding. Pelvic exam was performed with patient prone. There is no rash, tenderness or lesion on the right labia. There is no rash, tenderness or lesion on the left labia. Uterus is not enlarged and not tender. Right adnexum displays no mass, no tenderness and no fullness. Left adnexum displays no mass, no tenderness and no fullness.  Musculoskeletal:  Right ankle: Normal.  Left ankle: Normal.  Right foot: She exhibits tenderness. She exhibits normal range of motion, no bony tenderness, no swelling and no deformity.  Left foot: Normal.  Lymphadenopathy:  She has no cervical adenopathy.  She has no axillary adenopathy.  Neurological: She is alert. She has normal strength. No cranial nerve deficit or sensory deficit.  Skin: Skin is warm, dry and intact. No rash  noted.  Psychiatric: Her speech is normal and behavior is normal. Judgment normal. Her mood appears not anxious. Cognition and memory are normal. She does not exhibit a depressed  mood.  Assessment & Plan:   The patient's preventative maintenance and recommended screening tests for an annual wellness exam were reviewed in full today.  Brought up to date unless services declined.  Counselled on the importance of diet, exercise, and its role in overall health and mortality.  The patient's FH and SH was reviewed, including their home life, tobacco status, and drug and alcohol status.   PAP 2011 nml, on q3 year schedule, Due today , DVE yearly  DEXA: osteopenia 2012  On fosamax >5 years. Mammogram, nml 07/2012.  Vaccines: Up to date with TD.

## 2012-09-03 NOTE — Addendum Note (Signed)
Addended by: Eliezer Bottom on: 09/03/2012 11:19 AM   Modules accepted: Orders

## 2012-09-03 NOTE — Patient Instructions (Addendum)
Decrease animal fats like butter, cheese.   Regular exercise is important for cholesterol control as well.  Stop at front desk to set up bone density.  Stop fosamax.

## 2012-09-08 ENCOUNTER — Encounter: Payer: Self-pay | Admitting: Family Medicine

## 2012-09-17 ENCOUNTER — Ambulatory Visit
Admission: RE | Admit: 2012-09-17 | Discharge: 2012-09-17 | Disposition: A | Discharge status: Home / Self Care | Payer: BC Managed Care – PPO | Source: Ambulatory Visit | Attending: Family Medicine | Admitting: Family Medicine

## 2012-09-17 DIAGNOSIS — M899 Disorder of bone, unspecified: Secondary | ICD-10-CM

## 2014-08-17 ENCOUNTER — Other Ambulatory Visit: Payer: Self-pay

## 2014-08-17 DIAGNOSIS — Z1231 Encounter for screening mammogram for malignant neoplasm of breast: Secondary | ICD-10-CM

## 2014-08-30 ENCOUNTER — Ambulatory Visit
Admission: RE | Admit: 2014-08-30 | Discharge: 2014-08-30 | Disposition: A | Discharge status: Home / Self Care | Payer: BLUE CROSS/BLUE SHIELD | Source: Ambulatory Visit

## 2014-08-30 DIAGNOSIS — Z1231 Encounter for screening mammogram for malignant neoplasm of breast: Secondary | ICD-10-CM

## 2014-09-30 ENCOUNTER — Telehealth: Payer: Self-pay | Admitting: Family Medicine

## 2014-09-30 DIAGNOSIS — E7800 Pure hypercholesterolemia, unspecified: Secondary | ICD-10-CM

## 2014-09-30 DIAGNOSIS — M8580 Other specified disorders of bone density and structure, unspecified site: Secondary | ICD-10-CM

## 2014-09-30 NOTE — Telephone Encounter (Signed)
-----   Message from Baldomero Lamy sent at 09/28/2014  2:08 PM EDT ----- Regarding: Cpx labs 9/2, need orders thanks! :-) Please order  future cpx labs for pt's upcoming lab appt. Thanks Rodney Booze

## 2014-10-01 ENCOUNTER — Other Ambulatory Visit (INDEPENDENT_AMBULATORY_CARE_PROVIDER_SITE_OTHER): Payer: BLUE CROSS/BLUE SHIELD

## 2014-10-01 DIAGNOSIS — M8580 Other specified disorders of bone density and structure, unspecified site: Secondary | ICD-10-CM | POA: Diagnosis not present

## 2014-10-01 DIAGNOSIS — E7800 Pure hypercholesterolemia, unspecified: Secondary | ICD-10-CM

## 2014-10-01 LAB — COMPREHENSIVE METABOLIC PANEL
ALBUMIN: 4.8 g/dL (ref 3.5–5.2)
ALK PHOS: 32 U/L — AB (ref 39–117)
ALT: 15 U/L (ref 0–35)
AST: 19 U/L (ref 0–37)
BUN: 21 mg/dL (ref 6–23)
CALCIUM: 10 mg/dL (ref 8.4–10.5)
CO2: 30 mEq/L (ref 19–32)
Chloride: 103 mEq/L (ref 96–112)
Creatinine, Ser: 0.77 mg/dL (ref 0.40–1.20)
GFR: 84.44 mL/min (ref >60.00)
Glucose, Bld: 82 mg/dL (ref 70–99)
POTASSIUM: 4.3 meq/L (ref 3.5–5.1)
SODIUM: 142 meq/L (ref 135–145)
TOTAL PROTEIN: 7.2 g/dL (ref 6.0–8.3)
Total Bilirubin: 0.5 mg/dL (ref 0.2–1.2)

## 2014-10-01 LAB — LIPID PANEL
CHOLESTEROL: 254 mg/dL — AB (ref 0–200)
HDL: 87.7 mg/dL (ref >39.00)
LDL Cholesterol: 152 mg/dL — ABNORMAL HIGH (ref 0–99)
NonHDL: 166.57
Total CHOL/HDL Ratio: 3
Triglycerides: 71 mg/dL (ref 0.0–149.0)
VLDL: 14.2 mg/dL (ref 0.0–40.0)

## 2014-10-01 LAB — VITAMIN D 25 HYDROXY (VIT D DEFICIENCY, FRACTURES): VITD: 44.72 ng/mL (ref 30.00–100.00)

## 2014-10-05 ENCOUNTER — Ambulatory Visit (INDEPENDENT_AMBULATORY_CARE_PROVIDER_SITE_OTHER): Payer: BLUE CROSS/BLUE SHIELD | Admitting: Family Medicine

## 2014-10-05 ENCOUNTER — Encounter: Payer: Self-pay | Admitting: Family Medicine

## 2014-10-05 VITALS — BP 102/60 | HR 69 | Temp 98.5°F | Ht 64.0 in | Wt 113.5 lb

## 2014-10-05 DIAGNOSIS — E7800 Pure hypercholesterolemia, unspecified: Secondary | ICD-10-CM

## 2014-10-05 DIAGNOSIS — Z0000 Encounter for general adult medical examination without abnormal findings: Secondary | ICD-10-CM

## 2014-10-05 MED ORDER — ESTROGENS, CONJUGATED 0.625 MG/GM VA CREA: TOPICAL_CREAM | VAGINAL | Status: DC

## 2014-10-05 NOTE — Progress Notes (Signed)
Pre visit review using our clinic review tool, if applicable. No additional management support is needed unless otherwise documented below in the visit note. 

## 2014-10-05 NOTE — Assessment & Plan Note (Signed)
Improving control with diet changes. 

## 2014-10-05 NOTE — Progress Notes (Signed)
The patient is here for annual wellness exam and preventative care.   She has been feeling well overall.   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 Sister with breast cancer at age 50. Neg BRCA1 and 2 gene.   Elevated Cholesterol: Trim healthy Momma diet, low glycemic diet.  LDL at goal < 160 on no med. Lab Results  Component Value Date   CHOL 254* 10/01/2014   HDL 87.70 10/01/2014   LDLCALC 152* 10/01/2014   LDLDIRECT 191.3 08/27/2012   TRIG 71.0 10/01/2014   CHOLHDL 3 10/01/2014  Using medications without problems:  None  Muscle aches: None  Diet compliance: yes Exercise:yes x 4 times a week Other complaints:  Wt Readings from Last 3 Encounters:  10/05/14 113 lb 8 oz (51.483 kg)  09/03/12 118 lb (53.524 kg)  02/26/12 126 lb 8 oz (57.38 kg)  Body mass index is 19.47 kg/(m^2).  Review of Systems  Constitutional: Negative for fever, chills, weight loss and malaise/fatigue.  HENT: Negative for congestion, hearing loss, nosebleeds and tinnitus.        Occasional not unusual for her  Eyes: Negative for double vision.  Respiratory: Negative for cough and shortness of breath.   Cardiovascular: Negative for leg swelling.  Gastrointestinal: Negative for heartburn, nausea, vomiting, abdominal pain, diarrhea and constipation.  Genitourinary: Negative for urgency and frequency.  Musculoskeletal: Negative for back pain and joint pain.  Skin: Negative for itching and rash.  Neurological: Positive for headaches.  Endo/Heme/Allergies: Does not bruise/bleed easily.  Psychiatric/Behavioral: Negative for depression.  She has been off premarin cream .. Now all vaginal dryness has returned.   Some pain and bleeding after intercourse. Objective:   Physical Exam  Constitutional: Vital signs are normal. She appears well-developed and well-nourished. She is cooperative. Non-toxic appearance. She does not appear ill. No distress.  HENT:  Head: Normocephalic.  Right Ear:  Hearing, tympanic membrane, external ear and ear canal normal.  Left Ear: Hearing, tympanic membrane, external ear and ear canal normal.  Nose: Nose normal.  Eyes: Conjunctivae, EOM and lids are normal. Pupils are equal, round, and reactive to light. No foreign bodies found.  Neck: Trachea normal and normal range of motion. Neck supple. Carotid bruit is not present. No mass and no thyromegaly present.  Cardiovascular: Normal rate, regular rhythm, S1 normal, S2 normal, normal heart sounds and intact distal pulses. Exam reveals no gallop.  No murmur heard.  Pulmonary/Chest: Effort normal and breath sounds normal. No respiratory distress. She has no wheezes. She has no rhonchi. She has no rales.  Abdominal: Soft. Normal appearance and bowel sounds are normal. She exhibits no distension, no fluid wave, no abdominal bruit and no mass. There is no hepatosplenomegaly. There is no tenderness. There is no rebound, no guarding and no CVA tenderness. No hernia.  Genitourinary: Vagina normal and uterus normal. No breast swelling, tenderness, discharge or bleeding. Pelvic exam was performed with patient supine.  Vaginal dryness noted. There is no rash, tenderness or lesion on the right labia. There is no rash, tenderness or lesion on the left labia. Uterus is not enlarged and not tender. Right adnexum displays no mass, no tenderness and no fullness. Left adnexum displays no mass, no tenderness and no fullness.  NO PAP. Musculoskeletal:  Right ankle: Normal.  Left ankle: Normal.  Right foot:  She exhibits normal range of motion, no bony tenderness, no swelling and no deformity.  Left foot: Normal.  Lymphadenopathy:  She has no  cervical adenopathy.  She has no axillary adenopathy.  Neurological: She is alert. She has normal strength. No cranial nerve deficit or sensory deficit.  Skin: Skin is warm, dry and intact. No rash noted.  Psychiatric: Her speech is normal and behavior is normal. Judgment normal.  Her mood appears not anxious. Cognition and memory are normal. She does not exhibit a depressed mood.  Assessment & Plan:   The patient's preventative maintenance and recommended screening tests for an annual wellness exam were reviewed in full today.  Brought up to date unless services declined.  Counselled on the importance of diet, exercise, and its role in overall health and mortality.  The patient's FH and SH was reviewed, including their home life, tobacco status, and drug and alcohol status.   PAP/HPV 2014 nml, on q3 year schedule,  DVE yearly  DEXA: osteopenia 2014decrease.. Plans on repeating next year.. No longer on fosamax >5 years, stopped 3 years ago. Mammogram, nml 08/2014.  Vaccines: Up to date with Td. Refused flu. HIV: refused

## 2014-10-05 NOTE — Patient Instructions (Addendum)
Low fat low cholesterol foods in diet. Try not to decrease caloric intake further.  Start vaginal dryness treatment.  Look into [employer]. Next year when scheduling mammogram have them call to get order for bone density.

## 2014-11-15 ENCOUNTER — Telehealth: Payer: Self-pay

## 2014-11-15 NOTE — Telephone Encounter (Signed)
Pt left v/m; premarin cream is not covered; insurance advised pt premarin pill insert has better coverage; pt wants to know if could substitute pill insert. Pt request cb.

## 2014-11-16 NOTE — Telephone Encounter (Signed)
Left message for Ms. Stephanie Dawson to return my call.

## 2014-11-16 NOTE — Telephone Encounter (Signed)
OK to change to pill insert. Call pt or pharmacy to get exact name of what is covered please.

## 2014-11-16 NOTE — Telephone Encounter (Addendum)
[facility]  She states Premarin comes in a cream form and an oral pill form.Please advise what to send in.  I am not sure what to prescribe.

## 2014-11-16 NOTE — Telephone Encounter (Signed)
I am confused.I have never had a pt insert premarin oral pill, vaginally.  The tablet I am most aware of is: Vagifem 10 mcg estradiol per vaginal tablet. Insert one tablet intravaginally daily for two weeks, followed by twice weekly We can send this in. If she is requesting to insert premarin 0.625 mg tablet vaginally?Marland Kitchen.. This would be off label as far as I can see.. I am not comfortable with this. If she would like, she can seen GYN for this issue.

## 2014-11-16 NOTE — Telephone Encounter (Signed)
Spoke with Stephanie Dawson and advised her the cost for the premarin vaginal cream vs premarin that is taken by mouth.  Not sure what her insurance meant by Premarin insert unless they were referring to Vagifem.   The cost is a factor, for Stephanie Dawson, for both the cream and pill.  . I also advised we could also refer her to a GYN to see what their recommendations are. She would like to research a little bit more before deciding what to do      I also advised her that most insurance companies are not long covering estrogen therapy because they consider it a high risk medication.  She states will call us back once she decides what she would like to do.

## 2014-11-16 NOTE — Telephone Encounter (Signed)
Spoke wit [employer].  They states the Premarin cream will cost the patient $90 or Premarin pills will cost around $60.  Please send in Rx.

## 2017-03-21 ENCOUNTER — Telehealth: Payer: Self-pay | Admitting: Family Medicine

## 2017-03-21 ENCOUNTER — Other Ambulatory Visit (INDEPENDENT_AMBULATORY_CARE_PROVIDER_SITE_OTHER): Payer: Managed Care, Other (non HMO)

## 2017-03-21 DIAGNOSIS — M8580 Other specified disorders of bone density and structure, unspecified site: Secondary | ICD-10-CM

## 2017-03-21 DIAGNOSIS — E7800 Pure hypercholesterolemia, unspecified: Secondary | ICD-10-CM

## 2017-03-21 LAB — LIPID PANEL
CHOL/HDL RATIO: 3
CHOLESTEROL: 279 mg/dL — AB (ref 0–200)
HDL: 94.4 mg/dL (ref >39.00)
LDL CALC: 171 mg/dL — AB (ref 0–99)
NonHDL: 184.83
Triglycerides: 69 mg/dL (ref 0.0–149.0)
VLDL: 13.8 mg/dL (ref 0.0–40.0)

## 2017-03-21 LAB — COMPREHENSIVE METABOLIC PANEL
ALT: 15 U/L (ref 0–35)
AST: 17 U/L (ref 0–37)
Albumin: 4.7 g/dL (ref 3.5–5.2)
Alkaline Phosphatase: 35 U/L — ABNORMAL LOW (ref 39–117)
BUN: 20 mg/dL (ref 6–23)
CHLORIDE: 102 meq/L (ref 96–112)
CO2: 32 meq/L (ref 19–32)
Calcium: 10 mg/dL (ref 8.4–10.5)
Creatinine, Ser: 0.85 mg/dL (ref 0.40–1.20)
GFR: 74.6 mL/min (ref >60.00)
GLUCOSE: 89 mg/dL (ref 70–99)
POTASSIUM: 4 meq/L (ref 3.5–5.1)
SODIUM: 141 meq/L (ref 135–145)
Total Bilirubin: 0.5 mg/dL (ref 0.2–1.2)
Total Protein: 6.9 g/dL (ref 6.0–8.3)

## 2017-03-21 NOTE — Telephone Encounter (Signed)
-----   Message from Alvina Chouerri J Walsh sent at 03/21/2017 10:10 AM EST ----- Regarding: lab orders for now Patient is scheduled for CPX labs, please order future labs, Thanks , Terri  Walk in

## 2017-03-22 LAB — VITAMIN D 25 HYDROXY (VIT D DEFICIENCY, FRACTURES): VITD: 38.61 ng/mL (ref 30.00–100.00)

## 2017-03-25 ENCOUNTER — Other Ambulatory Visit (HOSPITAL_COMMUNITY)
Admission: RE | Admit: 2017-03-25 | Discharge: 2017-03-25 | Disposition: A | Discharge status: Home / Self Care | Payer: Managed Care, Other (non HMO) | Source: Ambulatory Visit | Attending: Family Medicine | Admitting: Family Medicine

## 2017-03-25 ENCOUNTER — Ambulatory Visit (INDEPENDENT_AMBULATORY_CARE_PROVIDER_SITE_OTHER): Payer: Managed Care, Other (non HMO) | Admitting: Family Medicine

## 2017-03-25 ENCOUNTER — Encounter: Payer: Self-pay | Admitting: Family Medicine

## 2017-03-25 VITALS — BP 104/64 | HR 60 | Temp 98.5°F | Ht 64.0 in | Wt 118.5 lb

## 2017-03-25 DIAGNOSIS — Z0000 Encounter for general adult medical examination without abnormal findings: Secondary | ICD-10-CM

## 2017-03-25 DIAGNOSIS — E7800 Pure hypercholesterolemia, unspecified: Secondary | ICD-10-CM

## 2017-03-25 DIAGNOSIS — N898 Other specified noninflammatory disorders of vagina: Secondary | ICD-10-CM

## 2017-03-25 DIAGNOSIS — N952 Postmenopausal atrophic vaginitis: Secondary | ICD-10-CM | POA: Diagnosis not present

## 2017-03-25 DIAGNOSIS — Z124 Encounter for screening for malignant neoplasm of cervix: Secondary | ICD-10-CM | POA: Insufficient documentation

## 2017-03-25 NOTE — Progress Notes (Signed)
Subjective:    Patient ID: Stephanie DancerDeanna Dawson, female    DOB: 18-Dec-1965, 52 y.o.   MRN: [id number]  HPI  The patient is here for annual wellness exam and preventative care.    Elevated Cholesterol:  Inadequate control but 2.4% 10 year AHA risk Lab Results  Component Value Date   CHOL 279 (H) 03/21/2017   HDL 94.40 03/21/2017   LDLCALC 171 (H) 03/21/2017   LDLDIRECT 191.3 08/27/2012   TRIG 69.0 03/21/2017   CHOLHDL 3 03/21/2017  Using medications without problems: Muscle aches:  Diet compliance: good Exercise: 4 times a week 30 min. walking Other complaints:   Vaginal dryness.. Premarin cream helped but was too costly.  Social History /Family History/Past Medical History reviewed in detail and updated in EMR if needed. Blood pressure 104/64, pulse 60, temperature 98.5 F (36.9 C), temperature source Oral, height 5\' 4"  (1.626 m), weight 118 lb 8 oz (53.8 kg). Body mass index is 20.34 kg/m.   Review of Systems  Constitutional: Negative for fatigue and fever.  HENT: Negative for congestion.   Eyes: Negative for pain.  Respiratory: Negative for cough and shortness of breath.   Cardiovascular: Negative for chest pain, palpitations and leg swelling.  Gastrointestinal: Negative for abdominal pain.  Genitourinary: Negative for dysuria and vaginal bleeding.  Musculoskeletal: Negative for back pain.  Neurological: Negative for syncope, light-headedness and headaches.  Psychiatric/Behavioral: Negative for dysphoric mood.       Objective:   Physical Exam  Constitutional: Vital signs are normal. She appears well-developed and well-nourished. She is cooperative.  Non-toxic appearance. She does not appear ill. No distress.  HENT:  Head: Normocephalic.  Right Ear: Hearing, tympanic membrane, external ear and ear canal normal.  Left Ear: Hearing, tympanic membrane, external ear and ear canal normal.  Nose: Nose normal.  Eyes: Pupils are equal, round, and reactive to light.  Conjunctivae, EOM and lids are normal. Lids are everted and swept, no foreign bodies found.  Neck: Trachea normal and normal range of motion. Neck supple. Carotid bruit is not present. No thyroid mass and no thyromegaly present.  Cardiovascular: Normal rate, regular rhythm, S1 normal, S2 normal, normal heart sounds and intact distal pulses. Exam reveals no gallop.  No murmur heard. Pulmonary/Chest: Effort normal and breath sounds normal. No respiratory distress. She has no wheezes. She has no rhonchi. She has no rales. No breast swelling, tenderness, discharge or bleeding.  Abdominal: Soft. Normal appearance and bowel sounds are normal. She exhibits no distension, no fluid wave, no abdominal bruit and no mass. There is no hepatosplenomegaly. There is no tenderness. There is no rebound, no guarding and no CVA tenderness. No hernia.  Genitourinary: Vagina normal and uterus normal. Pelvic exam was performed with patient supine. There is no rash, tenderness or lesion on the right labia. There is no rash, tenderness or lesion on the left labia. Uterus is not enlarged and not tender. Cervix exhibits no motion tenderness, no discharge and no friability. Right adnexum displays no mass, no tenderness and no fullness. Left adnexum displays no mass, no tenderness and no fullness.  Lymphadenopathy:    She has no cervical adenopathy.    She has no axillary adenopathy.  Neurological: She is alert. She has normal strength. No cranial nerve deficit or sensory deficit.  Skin: Skin is warm, dry and intact. No rash noted.  Psychiatric: Her speech is normal and behavior is normal. Judgment normal. Her mood appears not anxious. Cognition and memory are normal. She does not  exhibit a depressed mood.          Assessment & Plan:  The patient's preventative maintenance and recommended screening tests for an annual wellness exam were reviewed in full today. Brought up to date unless services declined.  Counselled on  the importance of diet, exercise, and its role in overall health and mortality. The patient's FH and SH was reviewed, including their home life, tobacco status, and drug and alcohol status.   PAP/HPV 2014 nml, on q5 year schedule,  DVE yearly  PAP due today. DEXA: osteopenia 2014decrease.. Plans on repeating next year.. No longer on fosamax >5 years, stopped 3 years ago. Mammogram, nml 08/2014.  Vaccines: Up to date with Td. Refused flu. HIV: refued colonoscopy:  No family cancer screening.

## 2017-03-25 NOTE — Patient Instructions (Addendum)
Work on low cholesterol diet. Please stop at the front desk to set up referral. Call to set up mammogram on your own. Call when you decide on colon cancer screening method.

## 2017-03-27 ENCOUNTER — Encounter: Payer: Self-pay | Admitting: *Deleted

## 2017-03-27 LAB — CYTOLOGY - PAP
DIAGNOSIS: NEGATIVE
HPV (WINDOPATH): NOT DETECTED

## 2017-04-10 ENCOUNTER — Telehealth: Payer: Self-pay | Admitting: Family Medicine

## 2017-04-10 NOTE — Telephone Encounter (Signed)
Copied from CRM [id number]. Topic: Quick Communication - See Telephone Encounter >> Apr 10, 2017 12:34 PM Stephanie Dawson, Stephanie Dawson wrote: CRM for notification. See Telephone encounter for: 04/10/17. Patient would like to talk to Dr. Ermalene SearingBedsole or her CMA about having a prescription for Estradiol tab or cream. She said she prefer the tabs. Please call patient back, thanks.

## 2017-04-12 MED ORDER — ESTRADIOL 10 MCG VA TABS: 1.0000 | ORAL_TABLET | VAGINAL | 11 refills | Status: DC

## 2017-04-12 NOTE — Telephone Encounter (Signed)
Okay to refill her estradiol cream.. But at last appt she stated it was to costly... Please call and ask which form is covered best. If needed I can change the prescription.

## 2017-04-12 NOTE — Telephone Encounter (Signed)
Spoke to pt. Either form costs the same. Would prefer tablets.

## 2017-04-15 ENCOUNTER — Ambulatory Visit: Payer: Self-pay

## 2017-04-15 NOTE — Telephone Encounter (Signed)
Patient called about her Estraidol tablet, she says "I wanted to know if I should take it as prescribed or use it daily. I didn't know if the doctor knew I hadn't used it since 2014." I advised to take as prescribed, but I would be sending this question to the provider and someone will be contacting her with the recommendation, she verbalized understanding.  Reason for Disposition . [1] Caller requesting NON-URGENT health information AND [2] PCP's office is the best resource  Answer Assessment - Initial Assessment Questions 1. REASON FOR CALL or QUESTION: "What is your reason for calling today?" or "How can I best help you?" or "What question do you have that I can help answer?"     Do I take my medication as prescribed  Protocols used: INFORMATION ONLY CALL-A-AH

## 2017-04-16 MED ORDER — ESTRADIOL 10 MCG VA TABS: ORAL_TABLET | VAGINAL | 3 refills | Status: DC

## 2017-04-16 NOTE — Addendum Note (Signed)
Addended by: Kerby NoraBEDSOLE, AMY E on: 04/16/2017 04:52 PM   Modules accepted: Orders

## 2017-04-16 NOTE — Telephone Encounter (Signed)
Ms. Stephanie Dawson notified as instructed by telephone.  She states the problem is the quantity.  The prescription was written for 8 tablets.  That is not enough to do one tablet daily x 2 weeks then 1 tablet twice weekly.  Needs new Rx sent for more tablets.

## 2017-04-16 NOTE — Telephone Encounter (Signed)
Goal is lowest dose  For control of symptoms. She can start instead  at higher dose and decrease down.Marland Kitchen.Marland Kitchen.Insert 1 tablet (10 mcg) once daily for 2 weeks;  Then for maintenance: Insert 1 tablet twice weekly

## 2017-04-30 NOTE — Assessment & Plan Note (Signed)
Encouraged exercise, weight management, healthy eating habits. 2.4% 10 year AHA risk.. No med indicated.

## 2017-04-30 NOTE — Assessment & Plan Note (Signed)
Will look into more affordable options.

## 2017-05-02 ENCOUNTER — Other Ambulatory Visit: Payer: Self-pay | Admitting: Family Medicine

## 2017-05-02 DIAGNOSIS — Z1231 Encounter for screening mammogram for malignant neoplasm of breast: Secondary | ICD-10-CM

## 2017-05-24 ENCOUNTER — Ambulatory Visit
Admission: RE | Admit: 2017-05-24 | Discharge: 2017-05-24 | Disposition: A | Discharge status: Home / Self Care | Payer: Managed Care, Other (non HMO) | Source: Ambulatory Visit | Attending: Family Medicine | Admitting: Family Medicine

## 2017-05-24 DIAGNOSIS — Z1231 Encounter for screening mammogram for malignant neoplasm of breast: Secondary | ICD-10-CM

## 2017-12-24 ENCOUNTER — Ambulatory Visit (INDEPENDENT_AMBULATORY_CARE_PROVIDER_SITE_OTHER)
Admission: RE | Admit: 2017-12-24 | Discharge: 2017-12-24 | Disposition: A | Discharge status: Home / Self Care | Payer: Managed Care, Other (non HMO) | Source: Ambulatory Visit | Attending: Family Medicine | Admitting: Family Medicine

## 2017-12-24 ENCOUNTER — Encounter: Payer: Self-pay | Admitting: Family Medicine

## 2017-12-24 ENCOUNTER — Telehealth: Payer: Self-pay

## 2017-12-24 ENCOUNTER — Other Ambulatory Visit: Payer: Self-pay | Admitting: Family Medicine

## 2017-12-24 ENCOUNTER — Ambulatory Visit (INDEPENDENT_AMBULATORY_CARE_PROVIDER_SITE_OTHER): Payer: Managed Care, Other (non HMO) | Admitting: Family Medicine

## 2017-12-24 VITALS — BP 110/80 | HR 70 | Temp 98.7°F | Ht 64.0 in | Wt 112.0 lb

## 2017-12-24 DIAGNOSIS — M79671 Pain in right foot: Secondary | ICD-10-CM

## 2017-12-24 DIAGNOSIS — M25532 Pain in left wrist: Secondary | ICD-10-CM | POA: Diagnosis not present

## 2017-12-24 DIAGNOSIS — S52522D Torus fracture of lower end of left radius, subsequent encounter for fracture with routine healing: Secondary | ICD-10-CM

## 2017-12-24 NOTE — Telephone Encounter (Signed)
[facility] called report on lt wrist. Report is in Epic and printed report taken to dr Cottage HospitalBedsole's area.

## 2017-12-24 NOTE — Patient Instructions (Signed)
We will call you with X-ray results and recommendations.

## 2017-12-24 NOTE — Progress Notes (Signed)
Subjective:    Patient ID: Stephanie Dawson, female    DOB: 04-Apr-1965, 52 y.o.   MRN: [id number]  HPI  41 year ol thin female  with osteopenia reports fall 8 weeks ago.   She reports at church during scavenger hunt... She was knocked over backward.. Landed on back.  No head injury, no LOC. Landed on outstretched hand...soreness in distal radius..  Soreness still there, better but not resolve. Gets sore when holding coffee, pulling door  Has been wear a brace on right wrist.  Had stubbed right 4th toe 1 week prior to the fall... When she fell, someone had stepped on her foot. Was unable to weight bear after fall... She has been buddy taping toe but it is still sore and swollen, bruising resolve.  Low back was achy for 3 week.. Now resolved.   Social History /Family History/Past Medical History reviewed in detail and updated in EMR if needed. Blood pressure 110/80, pulse 70, temperature 98.7 F (37.1 C), temperature source Oral, height 5\' 4"  (1.626 m), weight 112 lb (50.8 kg).  Review of Systems  Constitutional: Negative for fatigue and fever.  HENT: Negative for ear pain.   Eyes: Negative for pain.  Respiratory: Negative for chest tightness and shortness of breath.   Cardiovascular: Negative for chest pain, palpitations and leg swelling.  Gastrointestinal: Negative for abdominal pain.  Genitourinary: Negative for dysuria.       Objective:   Physical Exam  Constitutional: Vital signs are normal. She appears well-developed and well-nourished. She is cooperative.  Non-toxic appearance. She does not appear ill. No distress.  HENT:  Head: Normocephalic.  Right Ear: Hearing, tympanic membrane, external ear and ear canal normal. Tympanic membrane is not erythematous, not retracted and not bulging.  Left Ear: Hearing, tympanic membrane, external ear and ear canal normal. Tympanic membrane is not erythematous, not retracted and not bulging.  Nose: No mucosal edema or rhinorrhea. Right  sinus exhibits no maxillary sinus tenderness and no frontal sinus tenderness. Left sinus exhibits no maxillary sinus tenderness and no frontal sinus tenderness.  Mouth/Throat: Uvula is midline, oropharynx is clear and moist and mucous membranes are normal.  Eyes: Pupils are equal, round, and reactive to light. Conjunctivae, EOM and lids are normal. Lids are everted and swept, no foreign bodies found.  Neck: Trachea normal and normal range of motion. Neck supple. Carotid bruit is not present. No thyroid mass and no thyromegaly present.  Cardiovascular: Normal rate, regular rhythm, S1 normal, S2 normal, normal heart sounds, intact distal pulses and normal pulses. Exam reveals no gallop and no friction rub.  No murmur heard. Pulmonary/Chest: Effort normal and breath sounds normal. No tachypnea. No respiratory distress. She has no decreased breath sounds. She has no wheezes. She has no rhonchi. She has no rales.  Abdominal: Soft. Normal appearance and bowel sounds are normal. There is no tenderness.  Musculoskeletal:  R foot: 4th toe swollen and slightly bruise.. ttp at PIP joint, also ttp over heads of 3rd and 4th MTPs Left wrist .. Positive finklestein.Marland Kitchen ttp over radial head  full ROm of fingers and thumb.    Neurological: She is alert.  Skin: Skin is warm, dry and intact. No rash noted.  Psychiatric: Her speech is normal and behavior is normal. Judgment and thought content normal. Her mood appears not anxious. Cognition and memory are normal. She does not exhibit a depressed mood.          Assessment & Plan:   Possible fracture in  left wrist and right foot.   Also likely possible Dequarvains tenosynovitis or tendonitis in wrist Possible foot sprain.   X-ray.. If negative.. Bracing of wrist with  thumbspica and start home PT.  Foot.. Home PT  NSAIDs as needed.

## 2017-12-24 NOTE — Telephone Encounter (Signed)
Mrs. Trudee KusterKilmer notified of results via telephone.

## 2017-12-30 ENCOUNTER — Ambulatory Visit (INDEPENDENT_AMBULATORY_CARE_PROVIDER_SITE_OTHER): Payer: Managed Care, Other (non HMO) | Admitting: Orthopedic Surgery

## 2017-12-30 ENCOUNTER — Encounter (INDEPENDENT_AMBULATORY_CARE_PROVIDER_SITE_OTHER): Payer: Self-pay | Admitting: Orthopedic Surgery

## 2017-12-30 DIAGNOSIS — M25532 Pain in left wrist: Secondary | ICD-10-CM | POA: Diagnosis not present

## 2017-12-30 NOTE — Progress Notes (Signed)
Office Visit Note   Patient: Stephanie DancerDeanna Dawson           Date of Birth: [birth year]           MRN: [id number] Visit Date: 12/30/2017 Requested by: Excell SeltzerBedsole, Amy E, MD [address] [place], [place] [id number] PCP: Excell SeltzerBedsole, Amy E, MD  Subjective: Chief Complaint  Patient presents with  . Left Wrist - Pain    HPI: Stephanie LundJanet is a 52 year old patient with left wrist pain.  She actually sustained an injury falling on her outstretched arm October 27, 2017.  She is been using a wrist brace.  She is doing better.  She is right-hand dominant.  She can pick up heavy things.  She states that she was on Fosamax for about 10 years.  She also takes vitamin D and calcium daily.  Denies any other orthopedic complaints.              ROS: All systems reviewed are negative as they relate to the chief complaint within the history of present illness.  Patient denies  fevers or chills.   Assessment & Plan: Visit Diagnoses:  1. Pain in left wrist     Plan: Impression is healed left distal radius fracture with some very mild loss of height and inclination but no loss of volar tilt.  Fracture is healed.  I think she can wean herself out of the brace and start working on getting some of her motion back.  No evidence of scaphoid injury.  This should be a self-limited problem.  She has any further issues in January she should come back but at this point the fracture is healed and no intervention is required.  Follow-Up Instructions: Return if symptoms worsen or fail to improve.   Orders:  No orders of the defined types were placed in this encounter.  No orders of the defined types were placed in this encounter.     Procedures: No procedures performed   Clinical Data: No additional findings.  Objective: Vital Signs: There were no vitals taken for this visit.  Physical Exam:   Constitutional: Patient appears well-developed HEENT:  Head: Normocephalic Eyes:EOM are normal Neck: Normal range of  motion Cardiovascular: Normal rate Pulmonary/chest: Effort normal Neurologic: Patient is alert Skin: Skin is warm Psychiatric: Patient has normal mood and affect    Ortho Exam: Ortho exam demonstrates mild tenderness around the distal radius but no swelling or warmth in the joint.  She is lost about 25 to 30 degrees of wrist dorsiflexion and palmar flexion on the left compared to the right.  Grip strength is slightly diminished as well.  Radial pulses intact as.  There is no scaphoid snuffbox tenderness.  Elbow range of motion is full on the left-hand side.  Specialty Comments:  No specialty comments available.  Imaging: No results found.   PMFS History: Patient Active Problem List   Diagnosis Date Noted  . HYPERCHOLESTEROLEMIA 12/02/2007  . VAGINAL POLYP 12/02/2007  . MENOPAUSE, EARLY 12/02/2007  . VAGINITIS, ATROPHIC 12/02/2007  . Osteopenia 12/02/2007   Past Medical History:  Diagnosis Date  . Disorder of bone and cartilage, unspecified   . Polyp of vagina   . Postmenopausal atrophic vaginitis   . Pure hypercholesterolemia   . Symptomatic menopausal or female climacteric states     Family History  Problem Relation Age of Onset  . Cancer Mother        lung  . Cancer Father  prostate   . Cancer Sister 89       breast  . Breast cancer Sister 63  . Coronary artery disease Maternal Grandfather   . Hyperlipidemia Maternal Grandfather     Past Surgical History:  Procedure Laterality Date  . VAGINAL DELIVERY     x 2  . vaginal polyp removal     Social History   Occupational History  . Occupation: Stay at home mom    Employer: unemployed  Tobacco Use  . Smoking status: Never Smoker  . Smokeless tobacco: Never Used  Substance and Sexual Activity  . Alcohol use: Yes    Alcohol/week: 0.0 standard drinks    Comment: one glass of wine   . Drug use: No  . Sexual activity: Not on file

## 2018-04-11 ENCOUNTER — Other Ambulatory Visit: Payer: Self-pay | Admitting: Family Medicine

## 2018-04-11 NOTE — Telephone Encounter (Signed)
LAST FILLED ON 04/16/2017 #22 WITH 3 REFILLS. FUTURE APPOINTMENT ON 05/30/2018 FOR PHYSICAL. PLEASE REVIEW

## 2018-05-13 ENCOUNTER — Other Ambulatory Visit: Payer: Self-pay | Admitting: Family Medicine

## 2018-05-26 ENCOUNTER — Other Ambulatory Visit: Payer: Managed Care, Other (non HMO)

## 2018-05-30 ENCOUNTER — Encounter: Payer: Managed Care, Other (non HMO) | Admitting: Family Medicine

## 2018-06-10 ENCOUNTER — Other Ambulatory Visit: Payer: Self-pay | Admitting: Family Medicine

## 2018-06-10 NOTE — Telephone Encounter (Signed)
Patient is wondering why she's not getting refills on her prescription. She said she use to have refills on the medication.  Patient said she uses [facility] and she doesn't know if that is affecting the refills. Patient uses [facility] [place].

## 2018-07-31 ENCOUNTER — Other Ambulatory Visit: Payer: Self-pay | Admitting: Family Medicine

## 2018-08-04 ENCOUNTER — Telehealth: Payer: Self-pay | Admitting: Family Medicine

## 2018-08-04 DIAGNOSIS — M8580 Other specified disorders of bone density and structure, unspecified site: Secondary | ICD-10-CM

## 2018-08-04 DIAGNOSIS — E7800 Pure hypercholesterolemia, unspecified: Secondary | ICD-10-CM

## 2018-08-04 NOTE — Telephone Encounter (Signed)
-----   Message from Ellamae Sia sent at 08/04/2018  8:54 AM EDT ----- Regarding: Lab orders for Tuesday, 7.7.20 Patient is scheduled for CPX labs, please order future labs, Thanks , Karna Christmas

## 2018-08-05 ENCOUNTER — Other Ambulatory Visit (INDEPENDENT_AMBULATORY_CARE_PROVIDER_SITE_OTHER): Payer: Managed Care, Other (non HMO)

## 2018-08-05 ENCOUNTER — Other Ambulatory Visit: Payer: Self-pay

## 2018-08-05 DIAGNOSIS — E7800 Pure hypercholesterolemia, unspecified: Secondary | ICD-10-CM

## 2018-08-05 DIAGNOSIS — M8580 Other specified disorders of bone density and structure, unspecified site: Secondary | ICD-10-CM

## 2018-08-05 LAB — LIPID PANEL
Cholesterol: 259 mg/dL — ABNORMAL HIGH (ref 0–200)
HDL: 92.8 mg/dL (ref >39.00)
LDL Cholesterol: 147 mg/dL — ABNORMAL HIGH (ref 0–99)
NonHDL: 165.97
Total CHOL/HDL Ratio: 3
Triglycerides: 96 mg/dL (ref 0.0–149.0)
VLDL: 19.2 mg/dL (ref 0.0–40.0)

## 2018-08-05 LAB — COMPREHENSIVE METABOLIC PANEL
ALT: 15 U/L (ref 0–35)
AST: 18 U/L (ref 0–37)
Albumin: 4.8 g/dL (ref 3.5–5.2)
Alkaline Phosphatase: 35 U/L — ABNORMAL LOW (ref 39–117)
BUN: 19 mg/dL (ref 6–23)
CO2: 29 mEq/L (ref 19–32)
Calcium: 9.4 mg/dL (ref 8.4–10.5)
Chloride: 102 mEq/L (ref 96–112)
Creatinine, Ser: 0.81 mg/dL (ref 0.40–1.20)
GFR: 73.82 mL/min (ref >60.00)
Glucose, Bld: 90 mg/dL (ref 70–99)
Potassium: 3.8 mEq/L (ref 3.5–5.1)
Sodium: 140 mEq/L (ref 135–145)
Total Bilirubin: 0.6 mg/dL (ref 0.2–1.2)
Total Protein: 6.8 g/dL (ref 6.0–8.3)

## 2018-08-05 LAB — VITAMIN D 25 HYDROXY (VIT D DEFICIENCY, FRACTURES): VITD: 35.27 ng/mL (ref 30.00–100.00)

## 2018-08-08 ENCOUNTER — Other Ambulatory Visit: Payer: Self-pay

## 2018-08-08 ENCOUNTER — Ambulatory Visit (INDEPENDENT_AMBULATORY_CARE_PROVIDER_SITE_OTHER): Payer: Managed Care, Other (non HMO) | Admitting: Family Medicine

## 2018-08-08 ENCOUNTER — Encounter: Payer: Self-pay | Admitting: Family Medicine

## 2018-08-08 VITALS — BP 90/62 | HR 66 | Temp 98.5°F | Ht 64.0 in | Wt 110.5 lb

## 2018-08-08 DIAGNOSIS — Z0000 Encounter for general adult medical examination without abnormal findings: Secondary | ICD-10-CM

## 2018-08-08 DIAGNOSIS — Z1231 Encounter for screening mammogram for malignant neoplasm of breast: Secondary | ICD-10-CM | POA: Diagnosis not present

## 2018-08-08 DIAGNOSIS — Z23 Encounter for immunization: Secondary | ICD-10-CM | POA: Diagnosis not present

## 2018-08-08 DIAGNOSIS — M8580 Other specified disorders of bone density and structure, unspecified site: Secondary | ICD-10-CM | POA: Diagnosis not present

## 2018-08-08 DIAGNOSIS — Z1211 Encounter for screening for malignant neoplasm of colon: Secondary | ICD-10-CM | POA: Diagnosis not present

## 2018-08-08 NOTE — Progress Notes (Signed)
Chief Complaint  Patient presents with  . Annual Exam    History of Present Illness: HPI  The patient is here for annual wellness exam and preventative care.    Elevated Cholesterol:  Improved control. 10 year risk of CAD.. 1.1% Lab Results  Component Value Date   CHOL 259 (H) 08/05/2018   HDL 92.80 08/05/2018   LDLCALC 147 (H) 08/05/2018   LDLDIRECT 191.3 08/27/2012   TRIG 96.0 08/05/2018   CHOLHDL 3 08/05/2018  Using medications without problems: Muscle aches:  Diet compliance:good, low carb, low fat Body mass index is 18.97 kg/m. Exercise: 3 times weekly, weights, walking Other complaints: Wt Readings from Last 3 Encounters:  08/08/18 110 lb 8 oz (50.1 kg)  12/24/17 112 lb (50.8 kg)  03/25/17 118 lb 8 oz (53.8 kg)     COVID 19 screen No recent travel or known exposure to COVID19 The patient denies respiratory symptoms of COVID 19 at this time.  The importance of social distancing was discussed today.   Review of Systems  Constitutional: Negative for chills and fever.  HENT: Negative for congestion and ear pain.   Eyes: Negative for pain and redness.  Respiratory: Negative for cough and shortness of breath.   Cardiovascular: Negative for chest pain, palpitations and leg swelling.  Gastrointestinal: Negative for abdominal pain, blood in stool, constipation, diarrhea, nausea and vomiting.  Genitourinary: Negative for dysuria.  Musculoskeletal: Negative for falls and myalgias.  Skin: Negative for rash.  Neurological: Negative for dizziness.  Psychiatric/Behavioral: Negative for depression. The patient is not nervous/anxious.       Past Medical History:  Diagnosis Date  . Disorder of bone and cartilage, unspecified   . Polyp of vagina   . Postmenopausal atrophic vaginitis   . Pure hypercholesterolemia   . Symptomatic menopausal or female climacteric states     reports that she has never smoked. She has never used smokeless tobacco. She reports current  alcohol use. She reports that she does not use drugs.   Current Outpatient Medications:  .  Calcium Carbonate-Vit D-Min 600-400 MG-UNIT TABS, Take by mouth. Take one tablet by mouth two times daily , Disp: , Rfl:  .  Multiple Vitamins-Calcium (ONE-A-DAY WOMENS FORMULA) TABS, Take 1 tablet by mouth daily., Disp: , Rfl:  .  YUVAFEM 10 MCG TABS vaginal tablet, INSERT 1 TAB VAGINALLY TWICE A WEEK, Disp: 8 tablet, Rfl: 0   Observations/Objective: Blood pressure 90/62, pulse 66, temperature 98.5 F (36.9 C), temperature source Temporal, height 5\' 4"  (1.626 m), weight 110 lb 8 oz (50.1 kg), SpO2 98 %.  Physical Exam Constitutional:      General: She is not in acute distress.    Appearance: Normal appearance. She is well-developed. She is not ill-appearing or toxic-appearing.  HENT:     Head: Normocephalic.     Right Ear: Hearing, tympanic membrane, ear canal and external ear normal.     Left Ear: Hearing, tympanic membrane, ear canal and external ear normal.     Nose: Nose normal.  Eyes:     General: Lids are normal. Lids are everted, no foreign bodies appreciated.     Conjunctiva/sclera: Conjunctivae normal.     Pupils: Pupils are equal, round, and reactive to light.  Neck:     Musculoskeletal: Normal range of motion and neck supple.     Thyroid: No thyroid mass or thyromegaly.     Vascular: No carotid bruit.     Trachea: Trachea normal.  Cardiovascular:  Rate and Rhythm: Normal rate and regular rhythm.     Heart sounds: Normal heart sounds, S1 normal and S2 normal. No murmur. No gallop.   Pulmonary:     Effort: Pulmonary effort is normal. No respiratory distress.     Breath sounds: Normal breath sounds. No wheezing, rhonchi or rales.  Abdominal:     General: Bowel sounds are normal. There is no distension or abdominal bruit.     Palpations: Abdomen is soft. There is no fluid wave or mass.     Tenderness: There is no abdominal tenderness. There is no guarding or rebound.      Hernia: No hernia is present.  Lymphadenopathy:     Cervical: No cervical adenopathy.  Skin:    General: Skin is warm and dry.     Findings: No rash.  Neurological:     Mental Status: She is alert.     Cranial Nerves: No cranial nerve deficit.     Sensory: No sensory deficit.  Psychiatric:        Mood and Affect: Mood is not anxious or depressed.        Speech: Speech normal.        Behavior: Behavior normal. Behavior is cooperative.        Judgment: Judgment normal.      Assessment and Plan   The patient's preventative maintenance and recommended screening tests for an annual wellness exam were reviewed in full today. Brought up to date unless services declined.  Counselled on the importance of diet, exercise, and its role in overall health and mortality. The patient's FH and SH was reviewed, including their home life, tobacco status, and drug and alcohol status.   PAP/HPV 2019 nml, on q5 year schedule,  No family history of ovarian cancer and asymptomatic.Marland Kitchen DEXA: osteopenia 2014decrease.. Plans on repeating next year.. No longer on fosamax >5 years, stopped 3 years ago. Mammogram, nml 08/2017 Vaccines: Up to date with Td. Refused flu. HIV: refued colonoscopy:  No family cancer. Given IFOb.   Eliezer Lofts, MD

## 2018-08-08 NOTE — Patient Instructions (Addendum)
Stop at lab to get stool cards.  We will call about mammogram and bone density.

## 2018-08-25 ENCOUNTER — Other Ambulatory Visit (INDEPENDENT_AMBULATORY_CARE_PROVIDER_SITE_OTHER): Payer: Managed Care, Other (non HMO)

## 2018-08-25 ENCOUNTER — Encounter: Payer: Self-pay | Admitting: *Deleted

## 2018-08-25 ENCOUNTER — Other Ambulatory Visit: Payer: Self-pay | Admitting: Family Medicine

## 2018-08-25 DIAGNOSIS — Z1211 Encounter for screening for malignant neoplasm of colon: Secondary | ICD-10-CM

## 2018-08-25 LAB — FECAL OCCULT BLOOD, IMMUNOCHEMICAL: Fecal Occult Bld: NEGATIVE

## 2018-09-19 ENCOUNTER — Other Ambulatory Visit: Payer: Self-pay | Admitting: Family Medicine

## 2018-11-05 ENCOUNTER — Ambulatory Visit
Admission: RE | Admit: 2018-11-05 | Discharge: 2018-11-05 | Disposition: A | Discharge status: Home / Self Care | Payer: Managed Care, Other (non HMO) | Source: Ambulatory Visit | Attending: Family Medicine | Admitting: Family Medicine

## 2018-11-05 ENCOUNTER — Other Ambulatory Visit: Payer: Self-pay

## 2018-11-05 DIAGNOSIS — Z1231 Encounter for screening mammogram for malignant neoplasm of breast: Secondary | ICD-10-CM

## 2018-11-05 DIAGNOSIS — M8580 Other specified disorders of bone density and structure, unspecified site: Secondary | ICD-10-CM

## 2018-11-06 ENCOUNTER — Encounter: Payer: Self-pay | Admitting: *Deleted

## 2019-02-22 IMAGING — DX DG FOOT COMPLETE 3+V*R*
3 series · 3 of 3 positions shown · non-contrast
Comparison: None.

CLINICAL DATA: Fall.

EXAM:
RIGHT FOOT COMPLETE - 3+ VIEW

[foot ap]
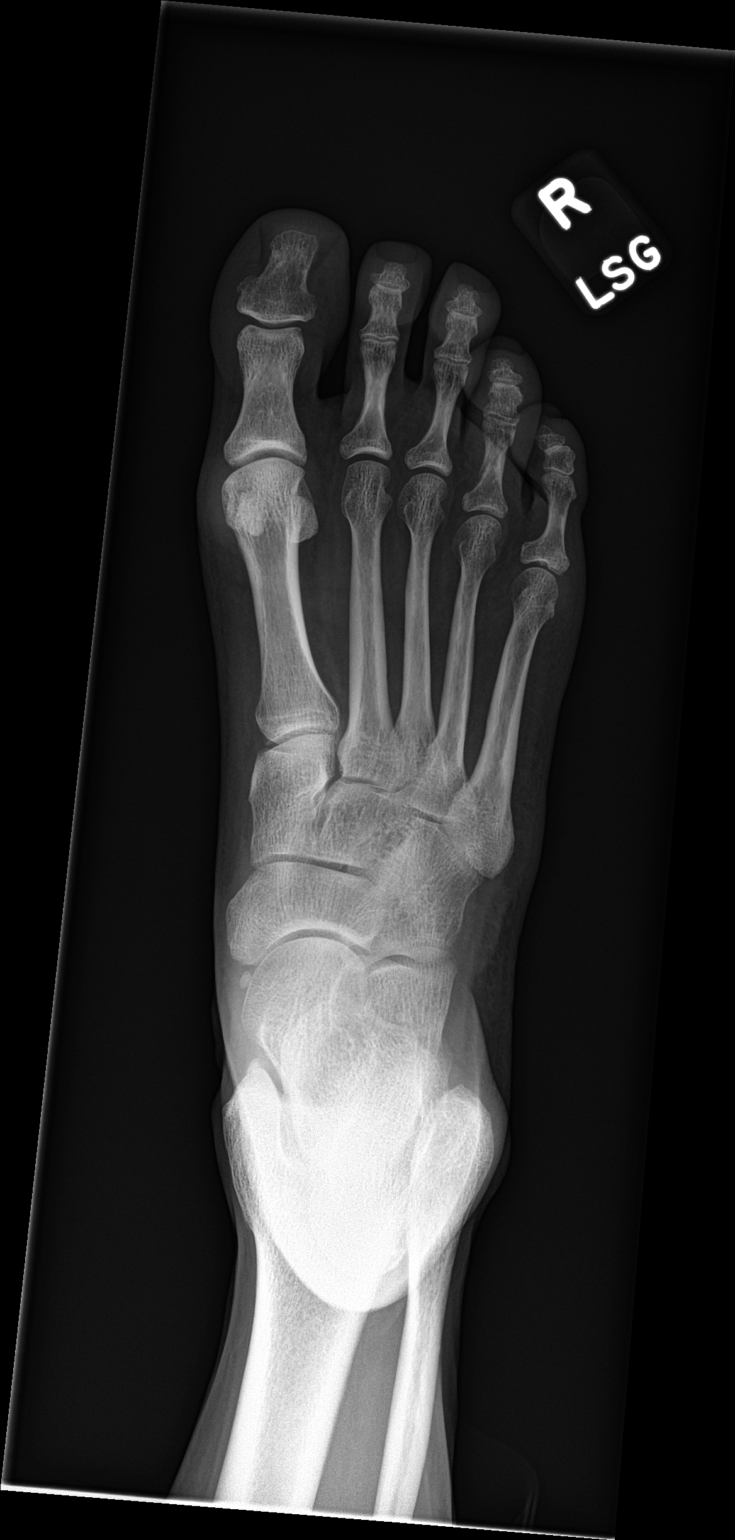

[foot obl]
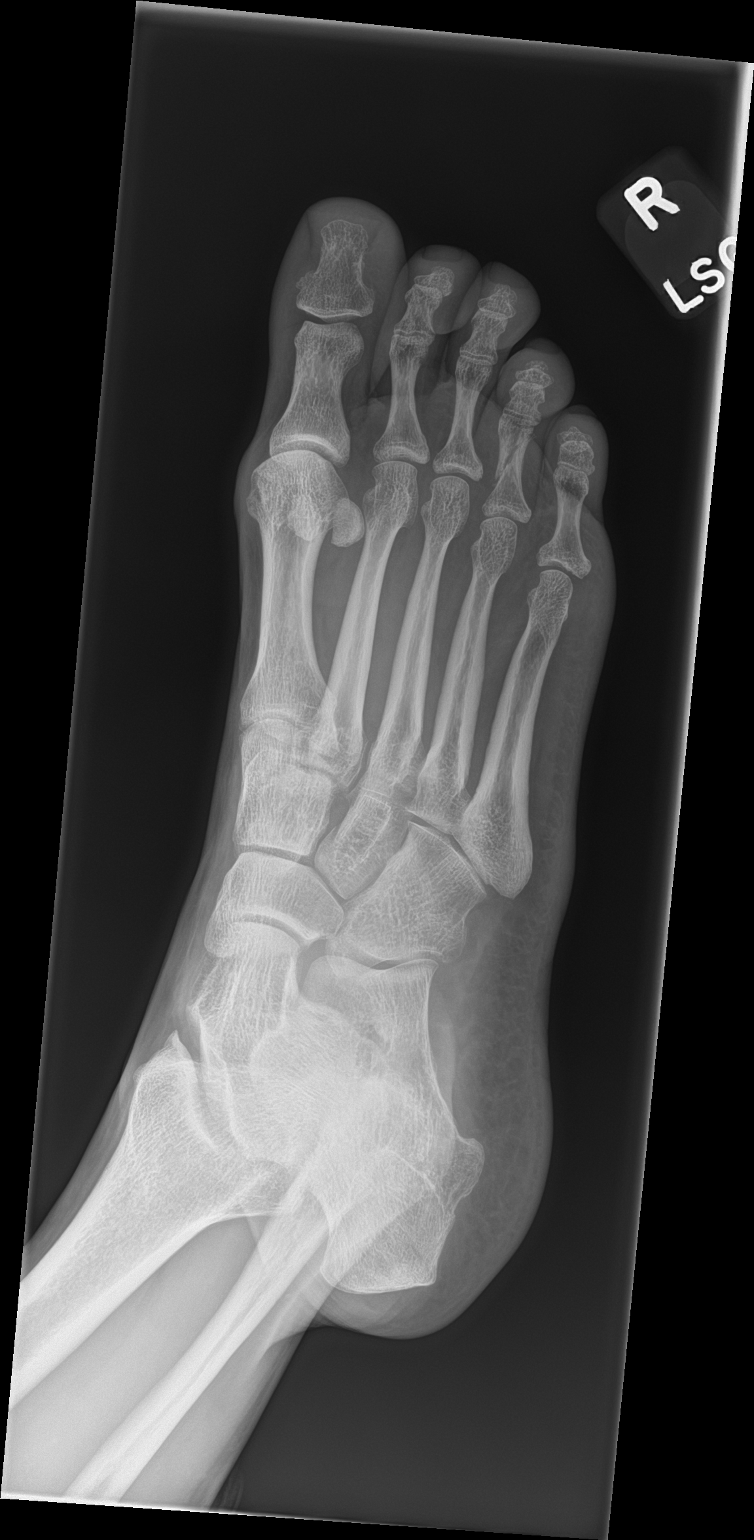

[foot lat]
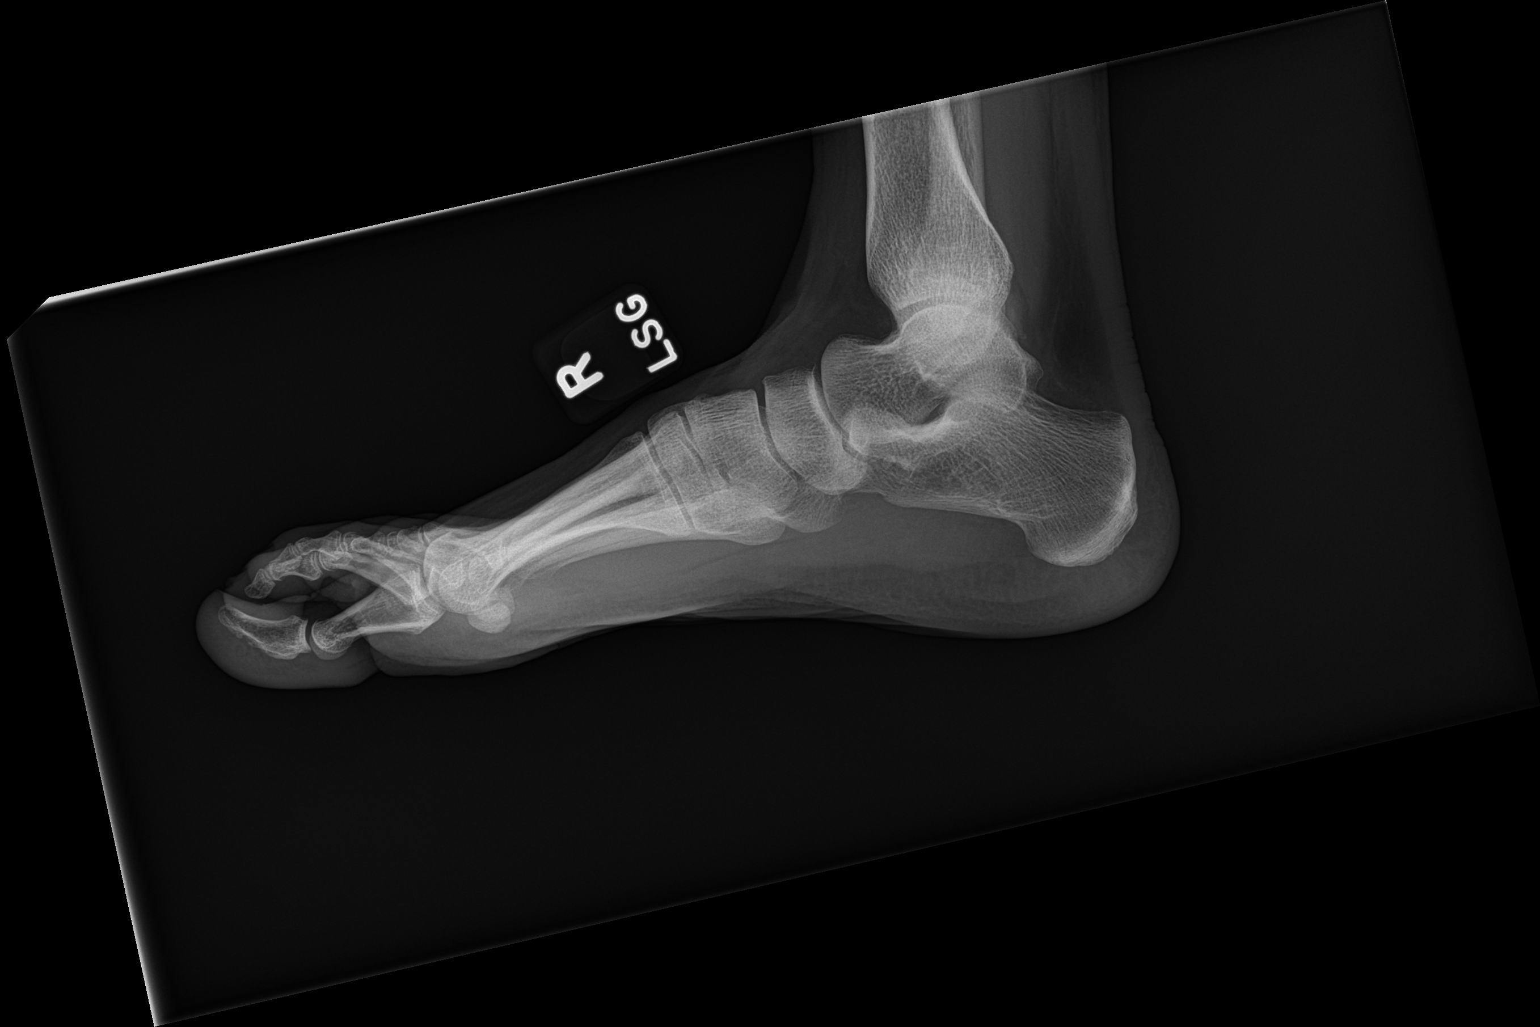

[3 of 3 positions shown; findings below may reference images not displayed]

FINDINGS: There is no evidence of fracture or dislocation. There is no
evidence of arthropathy or other focal bone abnormality. Soft
tissues are unremarkable.
IMPRESSION: Negative.

## 2019-08-06 ENCOUNTER — Telehealth: Payer: Self-pay | Admitting: Family Medicine

## 2019-08-06 DIAGNOSIS — E7800 Pure hypercholesterolemia, unspecified: Secondary | ICD-10-CM

## 2019-08-06 DIAGNOSIS — Z1159 Encounter for screening for other viral diseases: Secondary | ICD-10-CM

## 2019-08-06 DIAGNOSIS — M8580 Other specified disorders of bone density and structure, unspecified site: Secondary | ICD-10-CM

## 2019-08-06 NOTE — Telephone Encounter (Signed)
-----   Message from Aquilla Solian, RT sent at 07/22/2019  2:12 PM EDT ----- Regarding: Lab Orders for Friday 7.9.2021 Please place lab orders for Friday 7.9.2021, office visit for physical on Friday 7.16.2021 Thank you, Jones Bales RT(R)

## 2019-08-07 ENCOUNTER — Other Ambulatory Visit (INDEPENDENT_AMBULATORY_CARE_PROVIDER_SITE_OTHER): Payer: BC Managed Care – PPO

## 2019-08-07 ENCOUNTER — Other Ambulatory Visit: Payer: Self-pay

## 2019-08-07 DIAGNOSIS — M8580 Other specified disorders of bone density and structure, unspecified site: Secondary | ICD-10-CM | POA: Diagnosis not present

## 2019-08-07 DIAGNOSIS — Z1159 Encounter for screening for other viral diseases: Secondary | ICD-10-CM | POA: Diagnosis not present

## 2019-08-07 DIAGNOSIS — E7800 Pure hypercholesterolemia, unspecified: Secondary | ICD-10-CM | POA: Diagnosis not present

## 2019-08-07 LAB — COMPREHENSIVE METABOLIC PANEL
ALT: 13 U/L (ref 0–35)
AST: 17 U/L (ref 0–37)
Albumin: 4.7 g/dL (ref 3.5–5.2)
Alkaline Phosphatase: 38 U/L — ABNORMAL LOW (ref 39–117)
BUN: 18 mg/dL (ref 6–23)
CO2: 31 mEq/L (ref 19–32)
Calcium: 9.9 mg/dL (ref 8.4–10.5)
Chloride: 103 mEq/L (ref 96–112)
Creatinine, Ser: 0.78 mg/dL (ref 0.40–1.20)
GFR: 76.81 mL/min (ref >60.00)
Glucose, Bld: 91 mg/dL (ref 70–99)
Potassium: 4.2 mEq/L (ref 3.5–5.1)
Sodium: 141 mEq/L (ref 135–145)
Total Bilirubin: 0.4 mg/dL (ref 0.2–1.2)
Total Protein: 6.8 g/dL (ref 6.0–8.3)

## 2019-08-07 LAB — LIPID PANEL
Cholesterol: 267 mg/dL — ABNORMAL HIGH (ref 0–200)
HDL: 92.1 mg/dL (ref >39.00)
LDL Cholesterol: 158 mg/dL — ABNORMAL HIGH (ref 0–99)
NonHDL: 174.88
Total CHOL/HDL Ratio: 3
Triglycerides: 84 mg/dL (ref 0.0–149.0)
VLDL: 16.8 mg/dL (ref 0.0–40.0)

## 2019-08-07 LAB — VITAMIN D 25 HYDROXY (VIT D DEFICIENCY, FRACTURES): VITD: 39.29 ng/mL (ref 30.00–100.00)

## 2019-08-07 NOTE — Progress Notes (Signed)
No critical labs need to be addressed urgently. We will discuss labs in detail at upcoming office visit.   

## 2019-08-10 LAB — HEPATITIS C ANTIBODY
Hepatitis C Ab: NONREACTIVE
SIGNAL TO CUT-OFF: 0 (ref <1.00)

## 2019-08-14 ENCOUNTER — Ambulatory Visit (INDEPENDENT_AMBULATORY_CARE_PROVIDER_SITE_OTHER): Payer: BC Managed Care – PPO | Admitting: Family Medicine

## 2019-08-14 ENCOUNTER — Encounter: Payer: Managed Care, Other (non HMO) | Admitting: Family Medicine

## 2019-08-14 VITALS — BP 110/80 | HR 57 | Temp 97.6°F | Ht 64.0 in | Wt 116.8 lb

## 2019-08-14 DIAGNOSIS — N952 Postmenopausal atrophic vaginitis: Secondary | ICD-10-CM | POA: Diagnosis not present

## 2019-08-14 DIAGNOSIS — Z1211 Encounter for screening for malignant neoplasm of colon: Secondary | ICD-10-CM

## 2019-08-14 DIAGNOSIS — E7800 Pure hypercholesterolemia, unspecified: Secondary | ICD-10-CM | POA: Diagnosis not present

## 2019-08-14 NOTE — Progress Notes (Signed)
Chief Complaint  Patient presents with  . Annual Exam    History of Present Illness: HPI  The patient is here for annual wellness exam and preventative care.    Elevated Cholesterol: Stable control, no indication for statin. Lab Results  Component Value Date   CHOL 267 (H) 08/07/2019   HDL 92.10 08/07/2019   LDLCALC 158 (H) 08/07/2019   LDLDIRECT 191.3 08/27/2012   TRIG 84.0 08/07/2019   CHOLHDL 3 08/07/2019   The 10-year ASCVD risk score Denman George DC Jr., et al., 2013) is: 1.1%   Values used to calculate the score:     Age: 54 years     Sex: Female     Is Non-Hispanic African American: No     Diabetic: No     Tobacco smoker: No     Systolic Blood Pressure: 110 mmHg     Is BP treated: No     HDL Cholesterol: 92.1 mg/dL     Total Cholesterol: 267 mg/dL  Using medications without problems: Muscle aches:  Diet compliance: healthy, but more sweets snd animal fats lately. Exercise:4 times a week Other complaints:    This visit occurred during the SARS-CoV-2 public health emergency.  Safety protocols were in place, including screening questions prior to the visit, additional usage of staff PPE, and extensive cleaning of exam room while observing appropriate contact time as indicated for disinfecting solutions.   COVID 19 screen:  No recent travel or known exposure to COVID19 The patient denies respiratory symptoms of COVID 19 at this time. The importance of social distancing was discussed today.     ROS    Past Medical History:  Diagnosis Date  . Disorder of bone and cartilage, unspecified   . Polyp of vagina   . Postmenopausal atrophic vaginitis   . Pure hypercholesterolemia   . Symptomatic menopausal or female climacteric states     reports that she has never smoked. She has never used smokeless tobacco. She reports current alcohol use. She reports that she does not use drugs.   Current Outpatient Medications:  .  Calcium Carbonate-Vit D-Min 600-400 MG-UNIT TABS,  Take by mouth. Take one tablet by mouth two times daily , Disp: , Rfl:  .  Multiple Vitamins-Calcium (ONE-A-DAY WOMENS FORMULA) TABS, Take 1 tablet by mouth daily., Disp: , Rfl:  .  YUVAFEM 10 MCG TABS vaginal tablet, INSERT 1 TAB VAGINALLY TWICE A WEEK, Disp: 24 tablet, Rfl: 3   Observations/Objective: Blood pressure 110/80, pulse (!) 57, temperature 97.6 F (36.4 C), temperature source Temporal, height 5\' 4"  (1.626 m), weight 116 lb 12 oz (53 kg), SpO2 99 %.  Physical Exam Constitutional:      General: She is not in acute distress.    Appearance: Normal appearance. She is well-developed. She is not ill-appearing or toxic-appearing.  HENT:     Head: Normocephalic.     Right Ear: Hearing, tympanic membrane, ear canal and external ear normal.     Left Ear: Hearing, tympanic membrane, ear canal and external ear normal.     Nose: Nose normal.  Eyes:     General: Lids are normal. Lids are everted, no foreign bodies appreciated.     Conjunctiva/sclera: Conjunctivae normal.     Pupils: Pupils are equal, round, and reactive to light.  Neck:     Thyroid: No thyroid mass or thyromegaly.     Vascular: No carotid bruit.     Trachea: Trachea normal.  Cardiovascular:     Rate and Rhythm:  Normal rate and regular rhythm.     Heart sounds: Normal heart sounds, S1 normal and S2 normal. No murmur heard.  No gallop.   Pulmonary:     Effort: Pulmonary effort is normal. No respiratory distress.     Breath sounds: Normal breath sounds. No wheezing, rhonchi or rales.  Abdominal:     General: Bowel sounds are normal. There is no distension or abdominal bruit.     Palpations: Abdomen is soft. There is no fluid wave or mass.     Tenderness: There is no abdominal tenderness. There is no guarding or rebound.     Hernia: No hernia is present.  Musculoskeletal:     Cervical back: Normal range of motion and neck supple.  Lymphadenopathy:     Cervical: No cervical adenopathy.  Skin:    General: Skin is  warm and dry.     Findings: No rash.  Neurological:     Mental Status: She is alert.     Cranial Nerves: No cranial nerve deficit.     Sensory: No sensory deficit.  Psychiatric:        Mood and Affect: Mood is not anxious or depressed.        Speech: Speech normal.        Behavior: Behavior normal. Behavior is cooperative.        Judgment: Judgment normal.      Assessment and Plan   The patient's preventative maintenance and recommended screening tests for an annual wellness exam were reviewed in full today. Brought up to date unless services declined.  Counselled on the importance of diet, exercise, and its role in overall health and mortality. The patient's FH and SH was reviewed, including their home life, tobacco status, and drug and alcohol status.    PAP/HPV 2019 nml, on q5year schedule,  No family history of ovarian cancer and asymptomatic.Marland Kitchen DEXA: osteopenia stable 10/2018. No longer on fosamax >5 years, stopped 2017. Mammogram, nml 10/2018 Vaccines: Up to date with Td. Refused flu. Uptodate with COVID19 series. HIV: refused colonoscopy: No family cancer.Continue yearly  IFOb.    Kerby Nora, MD

## 2019-08-14 NOTE — Assessment & Plan Note (Signed)
Well controlled. Continue current medication.  

## 2019-08-14 NOTE — Assessment & Plan Note (Signed)
Encouraged exercise, weight loss, healthy eating habits. ? ?

## 2019-08-19 ENCOUNTER — Encounter: Payer: Self-pay | Admitting: *Deleted

## 2019-08-19 ENCOUNTER — Other Ambulatory Visit (INDEPENDENT_AMBULATORY_CARE_PROVIDER_SITE_OTHER): Payer: BC Managed Care – PPO

## 2019-08-19 DIAGNOSIS — Z1211 Encounter for screening for malignant neoplasm of colon: Secondary | ICD-10-CM

## 2019-08-19 LAB — FECAL OCCULT BLOOD, IMMUNOCHEMICAL: Fecal Occult Bld: NEGATIVE

## 2019-09-01 ENCOUNTER — Other Ambulatory Visit: Payer: Self-pay | Admitting: Family Medicine

## 2020-08-01 ENCOUNTER — Other Ambulatory Visit: Payer: Self-pay | Admitting: Family Medicine

## 2020-08-02 MED ORDER — YUVAFEM 10 MCG VA TABS: ORAL_TABLET | VAGINAL | 0 refills | Status: DC

## 2020-08-02 NOTE — Telephone Encounter (Signed)
Please schedule CPE with fasting labs prior for Dr. Bedsole. 

## 2020-08-02 NOTE — Telephone Encounter (Signed)
Received PA request.  Insurance prefers Brand Name.  Rx resent as DAW.

## 2020-09-07 ENCOUNTER — Telehealth: Payer: Self-pay | Admitting: Family Medicine

## 2020-09-07 DIAGNOSIS — E7800 Pure hypercholesterolemia, unspecified: Secondary | ICD-10-CM

## 2020-09-07 NOTE — Telephone Encounter (Signed)
-----   Message from Aquilla Solian, RT sent at 08/22/2020 11:05 AM EDT ----- Regarding: Lab Orders for Thursday 8.11.2022 Please place lab orders for Thursday 8.11.2022, office visit for physical on Thursday 8.18.2022 Thank you, Jones Bales RT(R)

## 2020-09-08 ENCOUNTER — Other Ambulatory Visit: Payer: BC Managed Care – PPO

## 2020-09-15 ENCOUNTER — Encounter: Payer: BC Managed Care – PPO | Admitting: Family Medicine

## 2020-10-30 ENCOUNTER — Other Ambulatory Visit: Payer: Self-pay | Admitting: Family Medicine

## 2020-11-01 ENCOUNTER — Telehealth: Payer: Self-pay

## 2020-11-01 NOTE — Telephone Encounter (Signed)
PA submitted today for Yuvafem 10 mg tablets through cover my meds.Waiting for response.

## 2020-11-03 NOTE — Telephone Encounter (Signed)
PA decision still pending

## 2020-11-04 NOTE — Telephone Encounter (Signed)
PA denied per covermymeds notes, states information on the decision is been faxed to our office. Not able to review the reason on the website. Awaiting fax/ fyi to PCP

## 2020-11-22 ENCOUNTER — Other Ambulatory Visit: Payer: Self-pay | Admitting: Family Medicine

## 2020-12-23 ENCOUNTER — Other Ambulatory Visit: Payer: Self-pay | Admitting: Family Medicine

## 2021-01-10 ENCOUNTER — Telehealth: Payer: Self-pay | Admitting: Family Medicine

## 2021-01-10 ENCOUNTER — Other Ambulatory Visit: Payer: BC Managed Care – PPO

## 2021-01-10 DIAGNOSIS — E7800 Pure hypercholesterolemia, unspecified: Secondary | ICD-10-CM

## 2021-01-10 NOTE — Telephone Encounter (Signed)
-----   Message from Alvina Chou sent at 12/27/2020  9:15 AM EST ----- Regarding: Lab orders for Tuesday, 12.13.22 Patient is scheduled for CPX labs, please order future labs, Thanks , Camelia Eng

## 2021-01-17 ENCOUNTER — Ambulatory Visit (INDEPENDENT_AMBULATORY_CARE_PROVIDER_SITE_OTHER): Payer: BC Managed Care – PPO | Admitting: Family Medicine

## 2021-01-17 ENCOUNTER — Encounter: Payer: Self-pay | Admitting: Family Medicine

## 2021-01-17 ENCOUNTER — Other Ambulatory Visit: Payer: Self-pay | Admitting: Family Medicine

## 2021-01-17 ENCOUNTER — Other Ambulatory Visit: Payer: Self-pay

## 2021-01-17 VITALS — BP 100/70 | HR 66 | Temp 98.2°F | Ht 63.5 in | Wt 120.4 lb

## 2021-01-17 DIAGNOSIS — Z1211 Encounter for screening for malignant neoplasm of colon: Secondary | ICD-10-CM | POA: Diagnosis not present

## 2021-01-17 DIAGNOSIS — E7800 Pure hypercholesterolemia, unspecified: Secondary | ICD-10-CM

## 2021-01-17 DIAGNOSIS — Z0000 Encounter for general adult medical examination without abnormal findings: Secondary | ICD-10-CM

## 2021-01-17 NOTE — Progress Notes (Signed)
Patient ID: Stephanie Dawson, female    DOB: [birth year], 55 y.o.   MRN: [id number]  This visit was conducted in person.  BP 100/70    Pulse 66    Temp 98.2 F (36.8 C) (Temporal)    Ht 5' 3.5" (1.613 m)    Wt 120 lb 6 oz (54.6 kg)    SpO2 99%    BMI 20.99 kg/m    CC: Chief Complaint  Patient presents with   Annual Exam    Subjective:   HPI: Stephanie Dawson is a 55 y.o. female presenting on 01/17/2021 for Annual Exam   Feeling well overall.  Elevated Cholesterol: Due for  re-eval. Using medications without problems:none Muscle aches:  Diet compliance: moderate Exercise:   occ Other complaints: Wt Readings from Last 3 Encounters:  01/17/21 120 lb 6 oz (54.6 kg)  08/14/19 116 lb 12 oz (53 kg)  08/08/18 110 lb 8 oz (50.1 kg)  Body mass index is 20.99 kg/m.    On Yuvafem for atrophic vaginitis.Marland Kitchen good control     Relevant past medical, surgical, family and social history reviewed and updated as indicated. Interim medical history since our last visit reviewed. Allergies and medications reviewed and updated. Outpatient Medications Prior to Visit  Medication Sig Dispense Refill   Calcium Carbonate-Vit D-Min 600-400 MG-UNIT TABS Take by mouth. Take one tablet by mouth two times daily      Multiple Vitamins-Calcium (ONE-A-DAY WOMENS FORMULA) TABS Take 1 tablet by mouth daily.     YUVAFEM 10 MCG TABS vaginal tablet INSERT 1 TAB VAGINALLY TWICE A WEEK 8 tablet 0   No facility-administered medications prior to visit.     Per HPI unless specifically indicated in ROS section below Review of Systems  Constitutional:  Negative for fatigue and fever.  HENT:  Negative for congestion.   Eyes:  Negative for pain.  Respiratory:  Negative for cough and shortness of breath.   Cardiovascular:  Negative for chest pain, palpitations and leg swelling.  Gastrointestinal:  Negative for abdominal pain.  Genitourinary:  Negative for dysuria and vaginal bleeding.   Musculoskeletal:  Negative for back pain.  Neurological:  Negative for syncope, light-headedness and headaches.  Psychiatric/Behavioral:  Negative for dysphoric mood.   Objective:  BP 100/70    Pulse 66    Temp 98.2 F (36.8 C) (Temporal)    Ht 5' 3.5" (1.613 m)    Wt 120 lb 6 oz (54.6 kg)    SpO2 99%    BMI 20.99 kg/m   Wt Readings from Last 3 Encounters:  01/17/21 120 lb 6 oz (54.6 kg)  08/14/19 116 lb 12 oz (53 kg)  08/08/18 110 lb 8 oz (50.1 kg)      Physical Exam Vitals and nursing note reviewed.  Constitutional:      General: She is not in acute distress.    Appearance: Normal appearance. She is well-developed. She is not ill-appearing or toxic-appearing.  HENT:     Head: Normocephalic.     Right Ear: Hearing, tympanic membrane, ear canal and external ear normal.     Left Ear: Hearing, tympanic membrane, ear canal and external ear normal.     Nose: Nose normal.  Eyes:     General: Lids are normal. Lids are everted, no foreign bodies appreciated.     Conjunctiva/sclera: Conjunctivae normal.     Pupils: Pupils are equal, round, and reactive to light.  Neck:     Thyroid: No thyroid mass  or thyromegaly.     Vascular: No carotid bruit.     Trachea: Trachea normal.  Cardiovascular:     Rate and Rhythm: Normal rate and regular rhythm.     Heart sounds: Normal heart sounds, S1 normal and S2 normal. No murmur heard.   No gallop.  Pulmonary:     Effort: Pulmonary effort is normal. No respiratory distress.     Breath sounds: Normal breath sounds. No wheezing, rhonchi or rales.  Abdominal:     General: Bowel sounds are normal. There is no distension or abdominal bruit.     Palpations: Abdomen is soft. There is no fluid wave or mass.     Tenderness: There is no abdominal tenderness. There is no guarding or rebound.     Hernia: No hernia is present.  Musculoskeletal:     Cervical back: Normal range of motion and neck supple.  Lymphadenopathy:     Cervical: No cervical  adenopathy.  Skin:    General: Skin is warm and dry.     Findings: No rash.  Neurological:     Mental Status: She is alert.     Cranial Nerves: No cranial nerve deficit.     Sensory: No sensory deficit.  Psychiatric:        Mood and Affect: Mood is not anxious or depressed.        Speech: Speech normal.        Behavior: Behavior normal. Behavior is cooperative.        Judgment: Judgment normal.      Results for orders placed or performed in visit on 08/19/19  Fecal occult blood, imunochemical   Specimen: Stool  Result Value Ref Range   Fecal Occult Bld Negative Negative    This visit occurred during the SARS-CoV-2 public health emergency.  Safety protocols were in place, including screening questions prior to the visit, additional usage of staff PPE, and extensive cleaning of exam room while observing appropriate contact time as indicated for disinfecting solutions.   COVID 19 screen:  No recent travel or known exposure to COVID19 The patient denies respiratory symptoms of COVID 19 at this time. The importance of social distancing was discussed today.   Assessment and Plan   The patient's preventative maintenance and recommended screening tests for an annual wellness exam were reviewed in full today. Brought up to date unless services declined.  Counselled on the importance of diet, exercise, and its role in overall health and mortality. The patient's FH and SH was reviewed, including their home life, tobacco status, and drug and alcohol status.   PAP/HPV 2019 nml, on q5 year schedule,  No family history of ovarian cancer and asymptomatic.Marland Kitchen DEXA: osteopenia stable 10/2018. No longer on fosamax >5 years, stopped 2017. Mammogram, nml 10/2018 .. repeat due Vaccines: Up to date with Td. Refused flu. Uptodate with COVID19 series. Consider shingrix HIV: refused  Colonoscopy:  No family cancer. Continue yearly  IFOB. Last  07/2019 negative, repeat due  Problem List Items Addressed  This Visit     HYPERCHOLESTEROLEMIA   Other Visit Diagnoses     Routine general medical examination at a health care facility    -  Primary   Colon cancer screening       Relevant Orders   Fecal occult blood, imunochemical      Orders Placed This Encounter  Procedures   Fecal occult blood, imunochemical    Standing Status:   Future    Standing Expiration Date:  01/17/2022    Kerby Nora, MD

## 2021-01-17 NOTE — Patient Instructions (Addendum)
Please stop at the lab to have labs drawn. Pick up stool cards in lab.  Please call the location of your choice from the menu below to schedule your Mammogram and/or Bone Density appointment.    [facility]   [facility]                      Phone:  [phone] [address]                               [place], [place] [address]                                                             Services: Traditional and 3D Mammogram, Bone Density    [facility]                 Phone: [phone] [address]                                                       [place], [place] [address]    Service: Bone Density ONLY   *this site does NOT perform mammograms  [facility]                        Phone:  [phone] [address]                                  [place], [place] [address]                                            Services:  3D Mammogram and Bone Density

## 2021-01-18 LAB — COMPREHENSIVE METABOLIC PANEL
ALT: 14 U/L (ref 0–35)
AST: 19 U/L (ref 0–37)
Albumin: 4.8 g/dL (ref 3.5–5.2)
Alkaline Phosphatase: 30 U/L — ABNORMAL LOW (ref 39–117)
BUN: 20 mg/dL (ref 6–23)
CO2: 28 mEq/L (ref 19–32)
Calcium: 9.9 mg/dL (ref 8.4–10.5)
Chloride: 102 mEq/L (ref 96–112)
Creatinine, Ser: 0.71 mg/dL (ref 0.40–1.20)
GFR: 95.34 mL/min (ref >60.00)
Glucose, Bld: 79 mg/dL (ref 70–99)
Potassium: 4.1 mEq/L (ref 3.5–5.1)
Sodium: 139 mEq/L (ref 135–145)
Total Bilirubin: 0.5 mg/dL (ref 0.2–1.2)
Total Protein: 7.2 g/dL (ref 6.0–8.3)

## 2021-01-18 LAB — LIPID PANEL
Cholesterol: 318 mg/dL — ABNORMAL HIGH (ref 0–200)
HDL: 99 mg/dL (ref >39.00)
LDL Cholesterol: 203 mg/dL — ABNORMAL HIGH (ref 0–99)
NonHDL: 218.88
Total CHOL/HDL Ratio: 3
Triglycerides: 78 mg/dL (ref 0.0–149.0)
VLDL: 15.6 mg/dL (ref 0.0–40.0)

## 2021-01-25 ENCOUNTER — Other Ambulatory Visit (INDEPENDENT_AMBULATORY_CARE_PROVIDER_SITE_OTHER): Payer: BC Managed Care – PPO

## 2021-01-25 DIAGNOSIS — Z1211 Encounter for screening for malignant neoplasm of colon: Secondary | ICD-10-CM | POA: Diagnosis not present

## 2021-01-26 LAB — FECAL OCCULT BLOOD, IMMUNOCHEMICAL: Fecal Occult Bld: NEGATIVE

## 2021-01-27 ENCOUNTER — Encounter: Payer: Self-pay | Admitting: Family Medicine

## 2021-01-27 ENCOUNTER — Other Ambulatory Visit: Payer: Self-pay | Admitting: Family Medicine

## 2021-01-27 DIAGNOSIS — Z1231 Encounter for screening mammogram for malignant neoplasm of breast: Secondary | ICD-10-CM

## 2021-02-01 ENCOUNTER — Ambulatory Visit
Admission: RE | Admit: 2021-02-01 | Discharge: 2021-02-01 | Disposition: A | Discharge status: Home / Self Care | Payer: BC Managed Care – PPO | Source: Ambulatory Visit | Attending: Family Medicine | Admitting: Family Medicine

## 2021-02-01 DIAGNOSIS — Z1231 Encounter for screening mammogram for malignant neoplasm of breast: Secondary | ICD-10-CM

## 2021-04-10 ENCOUNTER — Other Ambulatory Visit: Payer: Self-pay | Admitting: Family Medicine

## 2021-04-13 ENCOUNTER — Other Ambulatory Visit: Payer: Self-pay | Admitting: Family Medicine

## 2021-04-13 MED ORDER — YUVAFEM 10 MCG VA TABS: ORAL_TABLET | VAGINAL | 3 refills | Status: DC

## 2021-04-13 NOTE — Addendum Note (Signed)
Addended by: Consuella Lose on: 04/13/2021 11:13 AM ? ? Modules accepted: Orders ? ?

## 2021-11-17 ENCOUNTER — Ambulatory Visit (INDEPENDENT_AMBULATORY_CARE_PROVIDER_SITE_OTHER): Payer: BC Managed Care – PPO | Admitting: Family Medicine

## 2021-11-17 VITALS — BP 98/58 | HR 58 | Temp 97.9°F | Resp 16 | Ht 63.5 in | Wt 122.4 lb

## 2021-11-17 DIAGNOSIS — R42 Dizziness and giddiness: Secondary | ICD-10-CM
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
## 2022-01-02 ENCOUNTER — Other Ambulatory Visit: Payer: Self-pay | Admitting: Family Medicine

## 2022-01-02 DIAGNOSIS — Z1231 Encounter for screening mammogram for malignant neoplasm of breast: Secondary | ICD-10-CM

## 2022-01-15 ENCOUNTER — Telehealth: Payer: Self-pay | Admitting: Family Medicine

## 2022-01-15 DIAGNOSIS — E7800 Pure hypercholesterolemia, unspecified: Secondary | ICD-10-CM

## 2022-01-15 NOTE — Telephone Encounter (Signed)
-----   Message from Ronalee Red, RT sent at 01/08/2022  1:11 PM EST ----- Regarding: Wed 12/27 lab Patient is scheduled for cpx, please order future labs.  Thanks, Jae Dire

## 2022-01-24 ENCOUNTER — Other Ambulatory Visit (INDEPENDENT_AMBULATORY_CARE_PROVIDER_SITE_OTHER): Payer: BC Managed Care – PPO

## 2022-01-24 DIAGNOSIS — E7800 Pure hypercholesterolemia, unspecified: Secondary | ICD-10-CM

## 2022-01-24 LAB — LIPID PANEL
Cholesterol: 328 mg/dL — ABNORMAL HIGH (ref 0–200)
HDL: 96.6 mg/dL (ref >39.00)
LDL Cholesterol: 212 mg/dL — ABNORMAL HIGH (ref 0–99)
NonHDL: 231.22
Total CHOL/HDL Ratio: 3
Triglycerides: 98 mg/dL (ref 0.0–149.0)
VLDL: 19.6 mg/dL (ref 0.0–40.0)

## 2022-01-24 NOTE — Progress Notes (Signed)
No critical labs need to be addressed urgently. We will discuss labs in detail at upcoming office visit.   

## 2022-01-31 ENCOUNTER — Ambulatory Visit (INDEPENDENT_AMBULATORY_CARE_PROVIDER_SITE_OTHER): Payer: BC Managed Care – PPO | Admitting: Family Medicine

## 2022-01-31 VITALS — BP 120/82 | HR 63 | Temp 98.5°F | Ht 63.5 in | Wt 126.8 lb

## 2022-01-31 DIAGNOSIS — Z1211 Encounter for screening for malignant neoplasm of colon: Secondary | ICD-10-CM

## 2022-01-31 DIAGNOSIS — Z0000 Encounter for general adult medical examination without abnormal findings: Secondary | ICD-10-CM | POA: Diagnosis not present

## 2022-01-31 DIAGNOSIS — E7800 Pure hypercholesterolemia, unspecified: Secondary | ICD-10-CM

## 2022-01-31 NOTE — Progress Notes (Signed)
Patient ID: Stephanie Dawson, female    DOB: 1965/05/14, 57 y.o.   MRN: [id number]  This visit was conducted in person.  BP 120/82   Pulse 63   Temp 98.5 F (36.9 C) (Oral)   Ht 5' 3.5" (1.613 m)   Wt 126 lb 12.8 oz (57.5 kg)   SpO2 98%   BMI 22.11 kg/m    CC:  Chief Complaint  Patient presents with   Annual Exam    Subjective:   HPI: Stephanie Dawson is a 57 y.o. female presenting on 01/31/2022 for Annual Exam  The patient presents for annual  wellness, complete physical and review of chronic health problems. He/She also has the following concerns: none  She continues to have intermittent issues with " debarkment syndrome' since being on cruise ship. She is getting more used to it, fairly manageable.  Elevated Cholesterol:  Very poorly controlled. LDL  high. Lab Results  Component Value Date   CHOL 328 (H) 01/24/2022   HDL 96.60 01/24/2022   LDLCALC 212 (H) 01/24/2022   LDLDIRECT 191.3 08/27/2012   TRIG 98.0 01/24/2022   CHOLHDL 3 01/24/2022  The ASCVD Risk score (Arnett DK, et al., 2019) failed to calculate for the following reasons:   The valid total cholesterol range is 130 to 320 mg/dL   Diet compliance:none Exercise: 2 times weeks Other complaints:  Atrophic vaginitis, early menopause followed by GYN.  Heartburn Yuvafem 10 mcg vaginal tablet.       Relevant past medical, surgical, family and social history reviewed and updated as indicated. Interim medical history since our last visit reviewed. Allergies and medications reviewed and updated. Outpatient Medications Prior to Visit  Medication Sig Dispense Refill   Calcium Carbonate-Vit D-Min 600-400 MG-UNIT TABS Take by mouth. Take one tablet by mouth two times daily      Multiple Vitamins-Calcium (ONE-A-DAY WOMENS FORMULA) TABS Take 1 tablet by mouth daily.     YUVAFEM 10 MCG TABS vaginal tablet INSERT 1 TAB VAGINALLY TWICE A WEEK 24 tablet 3   No facility-administered medications prior to  visit.     Per HPI unless specifically indicated in ROS section below Review of Systems  Constitutional:  Negative for fatigue and fever.  HENT:  Negative for congestion.   Eyes:  Negative for pain.  Respiratory:  Negative for cough and shortness of breath.   Cardiovascular:  Negative for chest pain, palpitations and leg swelling.  Gastrointestinal:  Negative for abdominal pain.  Genitourinary:  Negative for dysuria and vaginal bleeding.  Musculoskeletal:  Negative for back pain.  Neurological:  Negative for syncope, light-headedness and headaches.  Psychiatric/Behavioral:  Negative for dysphoric mood.    Objective:  BP 120/82   Pulse 63   Temp 98.5 F (36.9 C) (Oral)   Ht 5' 3.5" (1.613 m)   Wt 126 lb 12.8 oz (57.5 kg)   SpO2 98%   BMI 22.11 kg/m   Wt Readings from Last 3 Encounters:  01/31/22 126 lb 12.8 oz (57.5 kg)  11/17/21 122 lb 6 oz (55.5 kg)  01/17/21 120 lb 6 oz (54.6 kg)      Physical Exam Vitals and nursing note reviewed.  Constitutional:      General: She is not in acute distress.    Appearance: Normal appearance. She is well-developed. She is not ill-appearing or toxic-appearing.  HENT:     Head: Normocephalic.     Right Ear: Hearing, tympanic membrane, ear canal and external ear normal.  Left Ear: Hearing, tympanic membrane, ear canal and external ear normal.     Nose: Nose normal.  Eyes:     General: Lids are normal. Lids are everted, no foreign bodies appreciated.     Conjunctiva/sclera: Conjunctivae normal.     Pupils: Pupils are equal, round, and reactive to light.  Neck:     Thyroid: No thyroid mass or thyromegaly.     Vascular: No carotid bruit.     Trachea: Trachea normal.  Cardiovascular:     Rate and Rhythm: Normal rate and regular rhythm.     Heart sounds: Normal heart sounds, S1 normal and S2 normal. No murmur heard.    No gallop.  Pulmonary:     Effort: Pulmonary effort is normal. No respiratory distress.     Breath sounds: Normal  breath sounds. No wheezing, rhonchi or rales.  Abdominal:     General: Bowel sounds are normal. There is no distension or abdominal bruit.     Palpations: Abdomen is soft. There is no fluid wave or mass.     Tenderness: There is no abdominal tenderness. There is no guarding or rebound.     Hernia: No hernia is present.  Musculoskeletal:     Cervical back: Normal range of motion and neck supple.  Lymphadenopathy:     Cervical: No cervical adenopathy.  Skin:    General: Skin is warm and dry.     Findings: No rash.  Neurological:     Mental Status: She is alert.     Cranial Nerves: No cranial nerve deficit.     Sensory: No sensory deficit.  Psychiatric:        Mood and Affect: Mood is not anxious or depressed.        Speech: Speech normal.        Behavior: Behavior normal. Behavior is cooperative.        Judgment: Judgment normal.       Results for orders placed or performed in visit on 01/24/22  Lipid panel  Result Value Ref Range   Cholesterol 328 (H) 0 - 200 mg/dL   Triglycerides 98.0 0.0 - 149.0 mg/dL   HDL 96.60 >39.00 mg/dL   VLDL 19.6 0.0 - 40.0 mg/dL   LDL Cholesterol 212 (H) 0 - 99 mg/dL   Total CHOL/HDL Ratio 3    NonHDL 231.22      COVID 19 screen:  No recent travel or known exposure to COVID19 The patient denies respiratory symptoms of COVID 19 at this time. The importance of social distancing was discussed today.   Assessment and Plan   The patient's preventative maintenance and recommended screening tests for an annual wellness exam were reviewed in full today. Brought up to date unless services declined.  Counselled on the importance of diet, exercise, and its role in overall health and mortality. The patient's FH and SH was reviewed, including their home life, tobacco status, and drug and alcohol status.   PAP/HPV 2019 nml, on q5 year schedule,  No family history of ovarian cancer and asymptomatic.Marland Kitchen DEXA: osteopenia stable 10/2018. No longer on fosamax  >5 years, stopped 2017. Mammogram, nml 02/01/2021 Vaccines: Up to date with Td. Refused flu. Uptodate with COVID19 series. Consider shingrix HIV: refused  Colonoscopy:  No family cancer. Continue yearly  IFOB. Last 12/2020 negative, repeat due  Problem List Items Addressed This Visit     HYPERCHOLESTEROLEMIA    Chronic, worsening control She has not been exercising or eating well, strong family history  of cholesterol issues.  We discussed starting a statin prescription medication but she is hesitant.  I have provided her information about red yeast rice as well as other herbal medications.  We discussed diet, exercise and lifestyle changes that will decrease her risk of heart attack and stroke.  She will return for lab only reevaluation of cholesterol in 3 months.      Relevant Orders   Lipid panel   Comprehensive metabolic panel   Other Visit Diagnoses     Routine general medical examination at a health care facility    -  Primary   Colon cancer screening       Relevant Orders   Fecal occult blood, imunochemical       Eliezer Lofts, MD

## 2022-01-31 NOTE — Assessment & Plan Note (Signed)
Chronic, worsening control She has not been exercising or eating well, strong family history of cholesterol issues.  We discussed starting a statin prescription medication but she is hesitant.  I have provided her information about red yeast rice as well as other herbal medications.  We discussed diet, exercise and lifestyle changes that will decrease her risk of heart attack and stroke.  She will return for lab only reevaluation of cholesterol in 3 months.

## 2022-01-31 NOTE — Patient Instructions (Addendum)
Consider balance retraining, yoga etc. For vertigo.  Stop at lab  to pick up colon cacer screening test.  Here is some info I have gathered for you for a trusted medical source. Lipid Management With Diet, Uptodate Feb 20.2021, Marisa Hua and [place]  Although earlier, smaller trials suggested a benefit of garlic supplementation, a subsequent larger trial failed to demonstrate improvement in lipids with use of any of three different garlic preparations (raw, powdered, or aged).  Bergamot: Improvements in serum lipids have been reported in trials of patients with metabolic syndrome, nonalcoholic fatty liver disease and in hyperlipidemic patients resistant to statin treatment. However, high-quality data on the effects of bergamot are lacking.  Suggestions for you if you would like to try natural supplements to lower cholesterol.  1.Souble fiber : Psyllium In a meta-analysis of randomized trials of patients with both normal and elevated cholesterol levels, the addition of 10.2 g/day of psyllium lowered the LDL cholesterol by an average of 12.8 mg/dL   2. Omega 3s: Mixed results in studies. Given you triglycerides are normal I would not use this.  3.Red yeast rice ( 2.4 grams divided half in AM half in PM): Red yeast rice is a fermented rice product, most often taken as a supplement, which can improve serum cholesterol  via  method similar to prescription statins.  Red yeast rice supplements lowered total cholesterol (208 versus 251 mg/dL) and LDL cholesterol (135 versus 175 mg/dL) compared with placebo.  4. Plant sterol.. There are naturally occurring sterols and stanols in nuts, legumes, whole grains, fruits, vegetables, and plant oils. In addition, a number of manufactured products enriched with plant sterols and stanols are commercially available. The margarines containing these compounds (eg, Benecol and Take Control spreads) have been available the longest and are the most studied  In a trial  of 150 patients with mild hypercholesterolemia,those consuming the fortified margarine experienced a 10 to 14 percent decrease in total cholesterol and LDL cholesterol.  5.Green Tea Catechins: .n a year-long randomized trial of more than 900 healthy postmenopausal women, green tea catechin supplements (1315 mg catechins/day) reduced total cholesterol and LDL cholesterol, increased triglycerides, and had no effect on HDL cholesterol

## 2022-02-02 ENCOUNTER — Other Ambulatory Visit: Payer: Self-pay | Admitting: *Deleted

## 2022-02-02 DIAGNOSIS — Z1211 Encounter for screening for malignant neoplasm of colon: Secondary | ICD-10-CM

## 2022-02-05 LAB — FECAL OCCULT BLOOD, IMMUNOCHEMICAL: Fecal Occult Bld: NEGATIVE

## 2022-02-26 ENCOUNTER — Ambulatory Visit
Admission: RE | Admit: 2022-02-26 | Discharge: 2022-02-26 | Disposition: A | Discharge status: Home / Self Care | Payer: BC Managed Care – PPO | Source: Ambulatory Visit | Attending: Family Medicine | Admitting: Family Medicine

## 2022-02-26 DIAGNOSIS — Z1231 Encounter for screening mammogram for malignant neoplasm of breast: Secondary | ICD-10-CM | POA: Diagnosis not present

## 2022-03-16 ENCOUNTER — Other Ambulatory Visit: Payer: Self-pay | Admitting: Family Medicine

## 2022-04-02 IMAGING — MG MM DIGITAL SCREENING BILAT W/ TOMO AND CAD
8 series · 9 of 24 positions shown · non-contrast
Comparison: Previous exam(s).

CLINICAL DATA: Screening.

EXAM:
DIGITAL SCREENING BILATERAL MAMMOGRAM WITH TOMOSYNTHESIS AND CAD
TECHNIQUE: Bilateral screening digital craniocaudal and mediolateral oblique
mammograms were obtained. Bilateral screening digital breast
tomosynthesis was performed. The images were evaluated with
computer-aided detection.

[L CC synth-2D]
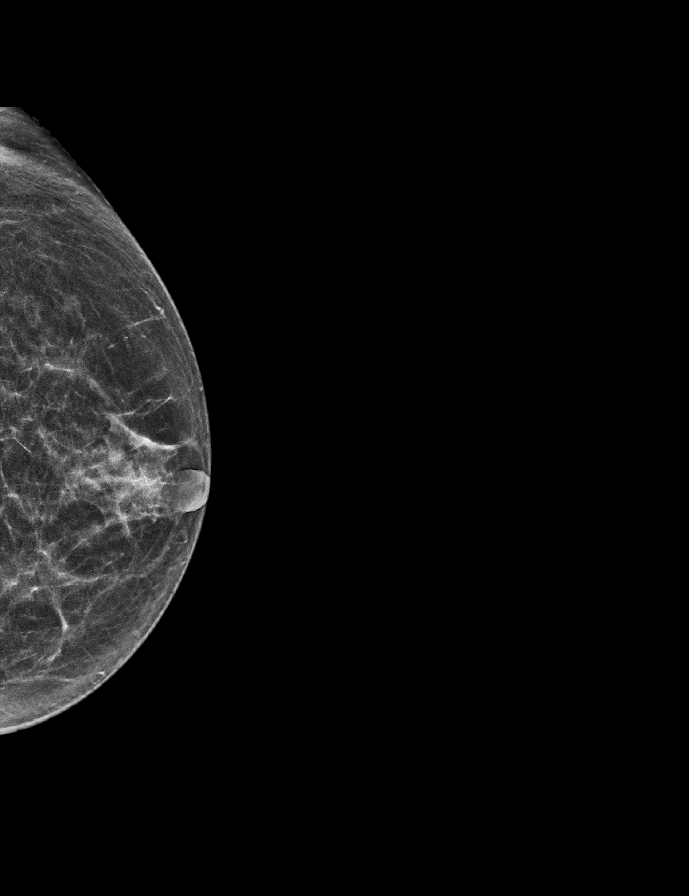

[R MLO synth-2D]
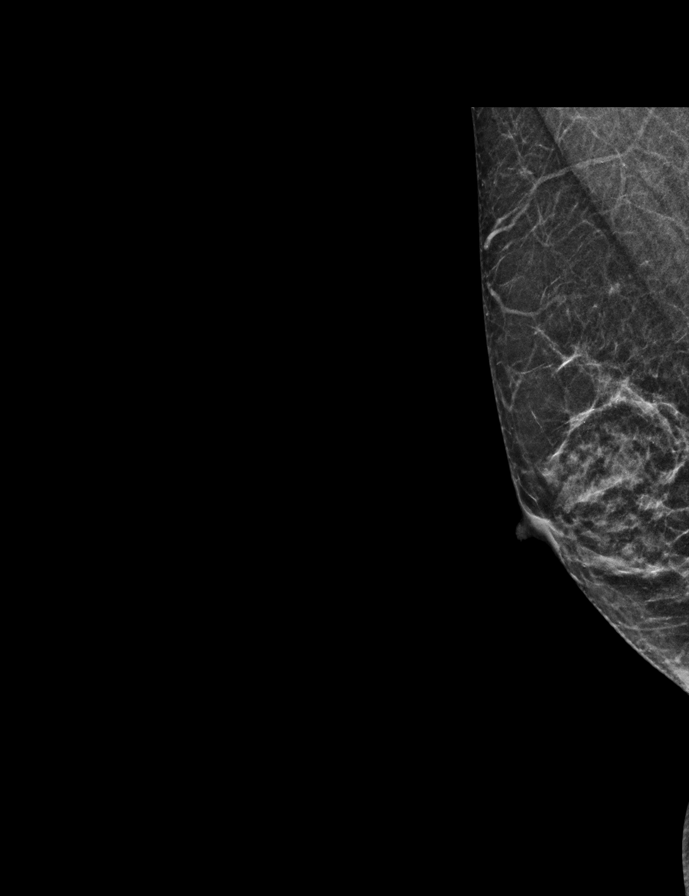

[L MLO synth-2D]
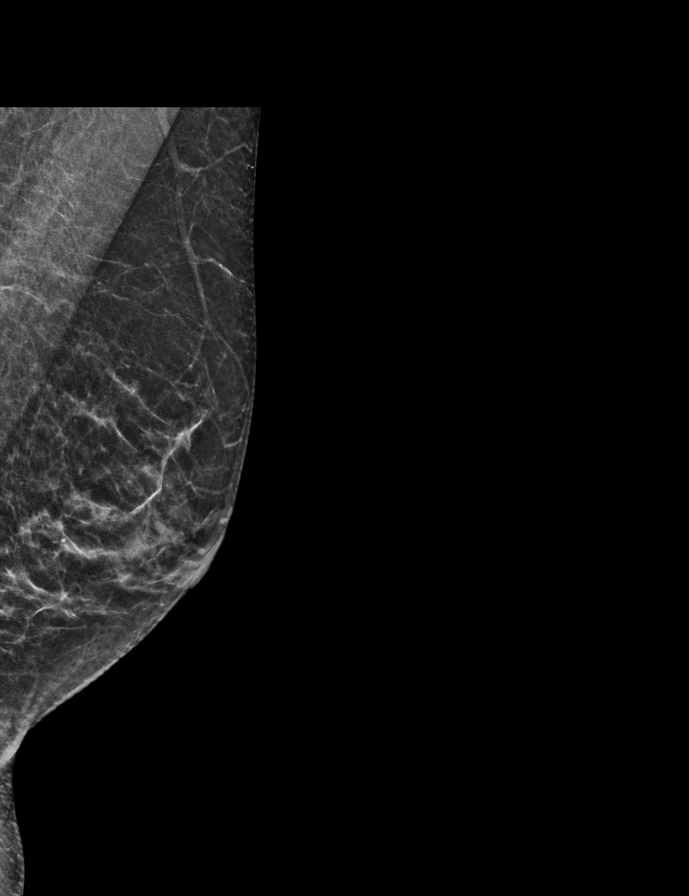

[R CC synth-2D]
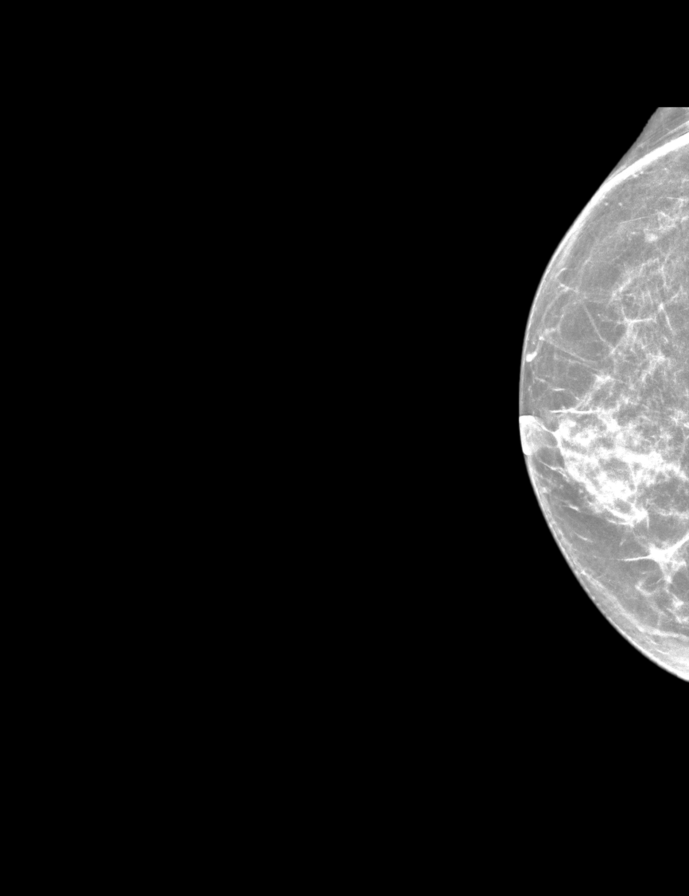

[R MLO tomo · 2 of 38 frames shown]
[frame 13/38]
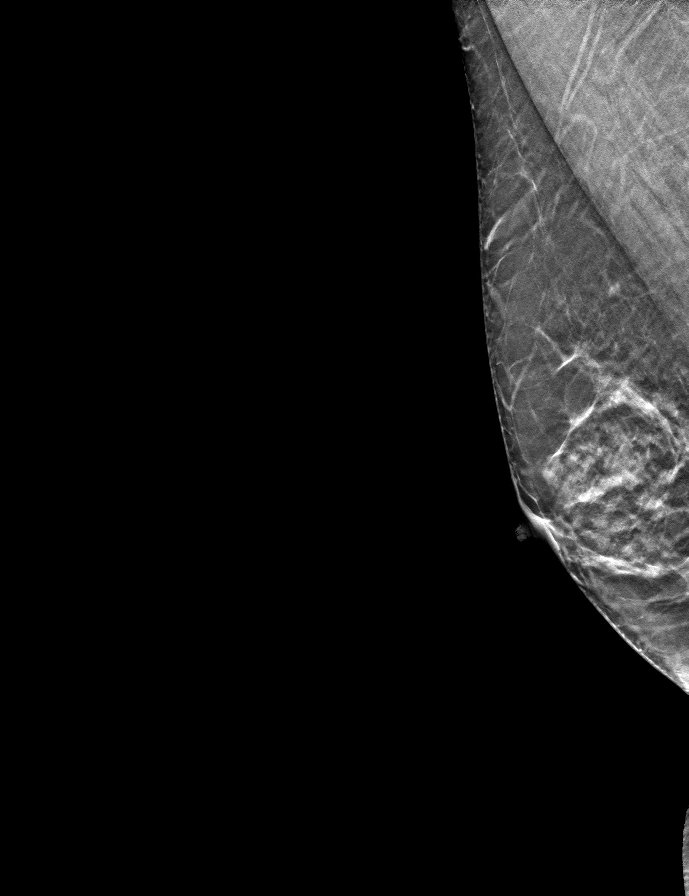
[frame 19/38]
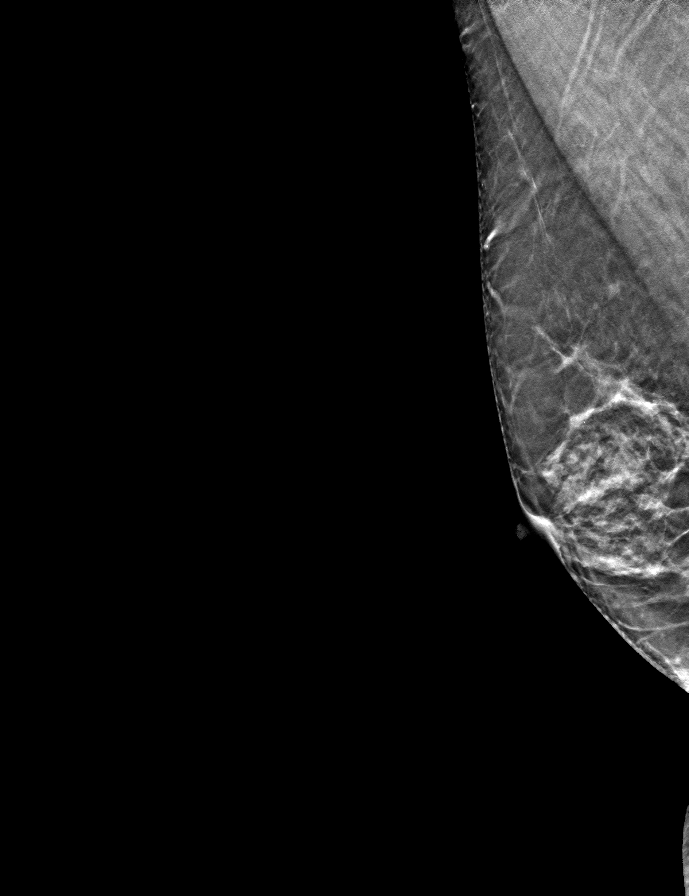

[R CC tomo · tomo slice 25/49.0]
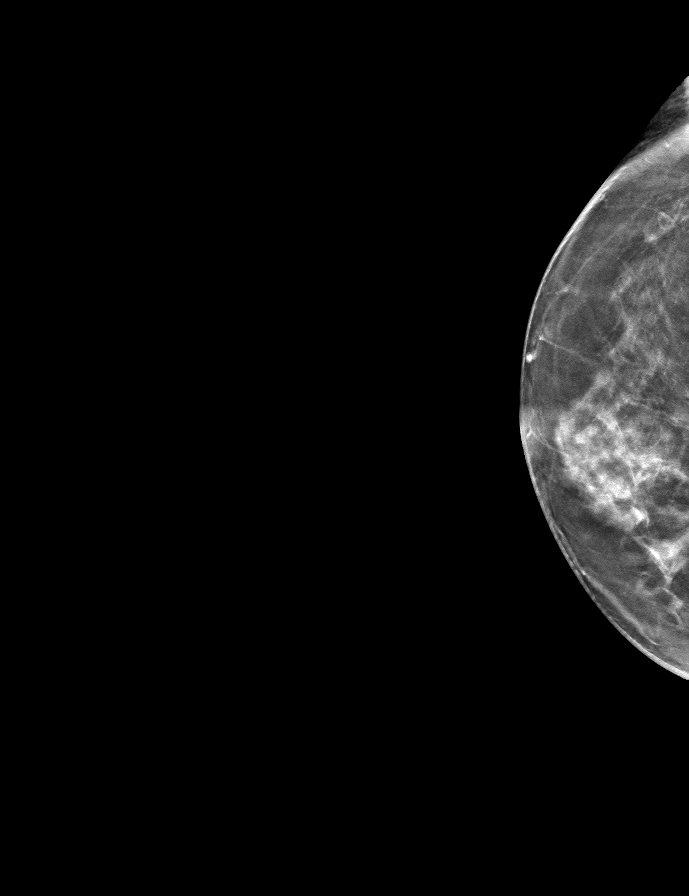

[L CC tomo · tomo slice 27/52.0]
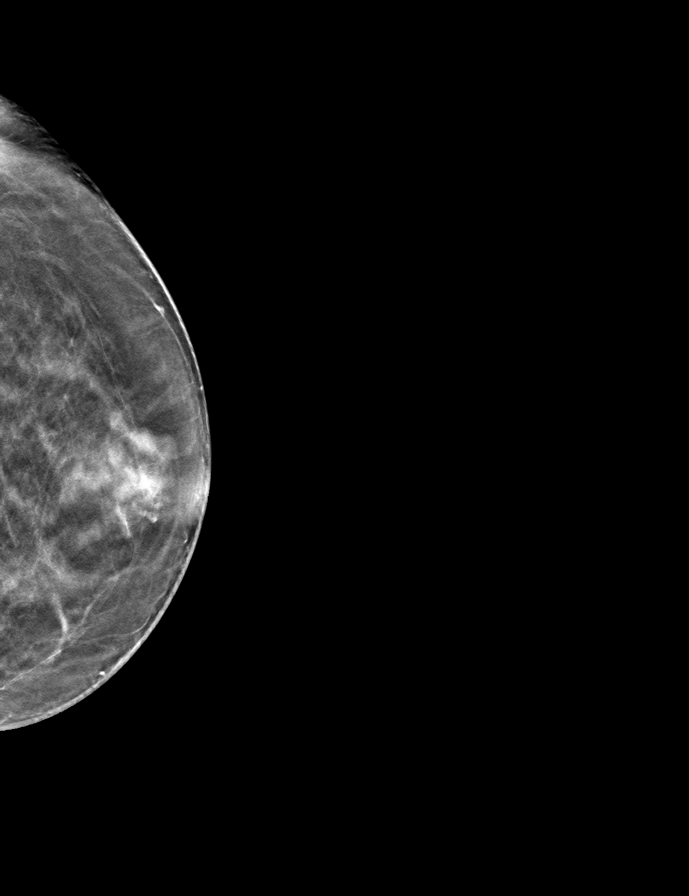

[L MLO tomo · tomo slice 23/46.0]
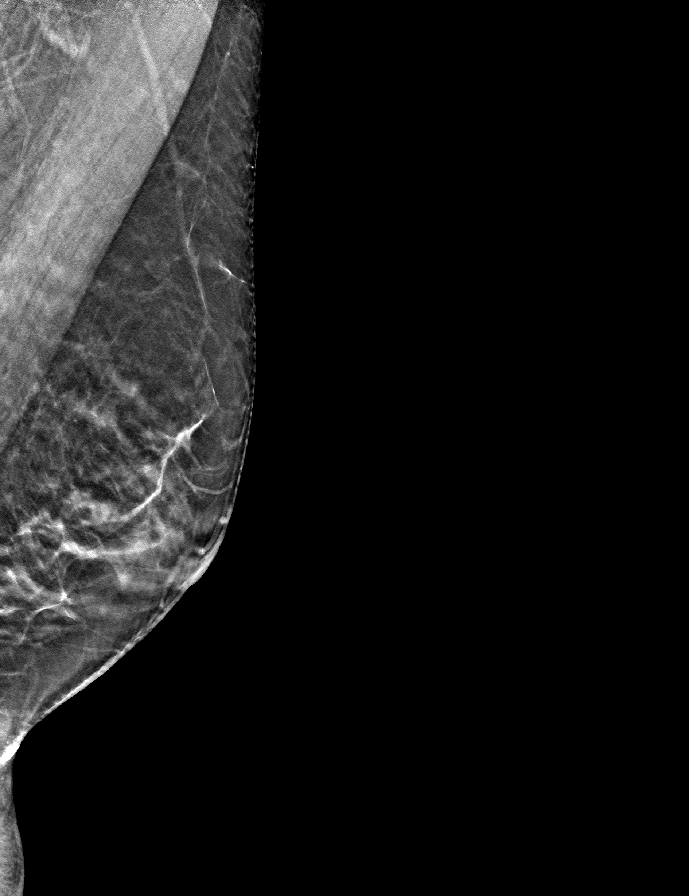

[9 of 24 positions shown; findings below may reference images not displayed]

ACR Breast Density Category c: The breast tissue is heterogeneously
dense, which may obscure small masses.
FINDINGS: There are no findings suspicious for malignancy.
IMPRESSION: No mammographic evidence of malignancy. A result letter of this
screening mammogram will be mailed directly to the patient.

RECOMMENDATION:
Screening mammogram in one year. (Code:Q3-W-BC3)

BI-RADS CATEGORY  1: Negative.

## 2022-04-30 ENCOUNTER — Other Ambulatory Visit (INDEPENDENT_AMBULATORY_CARE_PROVIDER_SITE_OTHER): Payer: BC Managed Care – PPO

## 2022-04-30 DIAGNOSIS — E7800 Pure hypercholesterolemia, unspecified: Secondary | ICD-10-CM

## 2022-04-30 LAB — COMPREHENSIVE METABOLIC PANEL
ALT: 11 U/L (ref 0–35)
AST: 16 U/L (ref 0–37)
Albumin: 4.7 g/dL (ref 3.5–5.2)
Alkaline Phosphatase: 35 U/L — ABNORMAL LOW (ref 39–117)
BUN: 17 mg/dL (ref 6–23)
CO2: 29 mEq/L (ref 19–32)
Calcium: 9.9 mg/dL (ref 8.4–10.5)
Chloride: 106 mEq/L (ref 96–112)
Creatinine, Ser: 0.76 mg/dL (ref 0.40–1.20)
GFR: 87.08 mL/min (ref >60.00)
Glucose, Bld: 90 mg/dL (ref 70–99)
Potassium: 4.2 mEq/L (ref 3.5–5.1)
Sodium: 139 mEq/L (ref 135–145)
Total Bilirubin: 0.4 mg/dL (ref 0.2–1.2)
Total Protein: 6.8 g/dL (ref 6.0–8.3)

## 2022-04-30 LAB — LIPID PANEL
Cholesterol: 237 mg/dL — ABNORMAL HIGH (ref 0–200)
HDL: 75 mg/dL (ref >39.00)
LDL Cholesterol: 142 mg/dL — ABNORMAL HIGH (ref 0–99)
NonHDL: 161.95
Total CHOL/HDL Ratio: 3
Triglycerides: 102 mg/dL (ref 0.0–149.0)
VLDL: 20.4 mg/dL (ref 0.0–40.0)

## 2022-12-17 ENCOUNTER — Other Ambulatory Visit: Payer: Self-pay | Admitting: Family Medicine

## 2022-12-17 DIAGNOSIS — Z1231 Encounter for screening mammogram for malignant neoplasm of breast: Secondary | ICD-10-CM

## 2023-01-07 ENCOUNTER — Telehealth: Payer: Self-pay | Admitting: *Deleted

## 2023-01-07 DIAGNOSIS — E7800 Pure hypercholesterolemia, unspecified: Secondary | ICD-10-CM

## 2023-01-07 NOTE — Telephone Encounter (Signed)
-----   Message from Alvina Chou sent at 01/07/2023  3:08 PM EST ----- Regarding: Lab orders for [facility], 1.2.25 Patient is scheduled for CPX labs, please order future labs, Thanks , Camelia Eng

## 2023-01-31 ENCOUNTER — Other Ambulatory Visit (INDEPENDENT_AMBULATORY_CARE_PROVIDER_SITE_OTHER): Payer: BC Managed Care – PPO

## 2023-01-31 DIAGNOSIS — E7800 Pure hypercholesterolemia, unspecified: Secondary | ICD-10-CM

## 2023-01-31 LAB — COMPREHENSIVE METABOLIC PANEL
ALT: 12 U/L (ref 0–35)
AST: 17 U/L (ref 0–37)
Albumin: 4.9 g/dL (ref 3.5–5.2)
Alkaline Phosphatase: 37 U/L — ABNORMAL LOW (ref 39–117)
BUN: 18 mg/dL (ref 6–23)
CO2: 28 meq/L (ref 19–32)
Calcium: 9.9 mg/dL (ref 8.4–10.5)
Chloride: 101 meq/L (ref 96–112)
Creatinine, Ser: 0.79 mg/dL (ref 0.40–1.20)
GFR: 82.69 mL/min (ref >60.00)
Glucose, Bld: 84 mg/dL (ref 70–99)
Potassium: 4.1 meq/L (ref 3.5–5.1)
Sodium: 141 meq/L (ref 135–145)
Total Bilirubin: 0.6 mg/dL (ref 0.2–1.2)
Total Protein: 7.2 g/dL (ref 6.0–8.3)

## 2023-01-31 LAB — LIPID PANEL
Cholesterol: 290 mg/dL — ABNORMAL HIGH (ref 0–200)
HDL: 86.8 mg/dL (ref >39.00)
LDL Cholesterol: 187 mg/dL — ABNORMAL HIGH (ref 0–99)
NonHDL: 202.79
Total CHOL/HDL Ratio: 3
Triglycerides: 77 mg/dL (ref 0.0–149.0)
VLDL: 15.4 mg/dL (ref 0.0–40.0)

## 2023-01-31 NOTE — Progress Notes (Signed)
 No critical labs need to be addressed urgently. We will discuss labs in detail at upcoming office visit.

## 2023-02-07 ENCOUNTER — Other Ambulatory Visit (HOSPITAL_COMMUNITY)
Admission: RE | Admit: 2023-02-07 | Discharge: 2023-02-07 | Disposition: A | Discharge status: Home / Self Care | Payer: BC Managed Care – PPO | Source: Ambulatory Visit | Attending: Family Medicine | Admitting: Family Medicine

## 2023-02-07 ENCOUNTER — Ambulatory Visit (INDEPENDENT_AMBULATORY_CARE_PROVIDER_SITE_OTHER): Payer: BC Managed Care – PPO | Admitting: Family Medicine

## 2023-02-07 ENCOUNTER — Encounter: Payer: Self-pay | Admitting: Family Medicine

## 2023-02-07 VITALS — BP 118/80 | HR 65 | Temp 98.1°F | Ht 63.0 in | Wt 125.2 lb

## 2023-02-07 DIAGNOSIS — Z0000 Encounter for general adult medical examination without abnormal findings: Secondary | ICD-10-CM

## 2023-02-07 DIAGNOSIS — E7800 Pure hypercholesterolemia, unspecified: Secondary | ICD-10-CM

## 2023-02-07 DIAGNOSIS — Z1211 Encounter for screening for malignant neoplasm of colon: Secondary | ICD-10-CM | POA: Diagnosis not present

## 2023-02-07 NOTE — Patient Instructions (Signed)
 Keep working on heart healthy low cholesterol diet!

## 2023-02-07 NOTE — Progress Notes (Signed)
 Patient ID: Stephanie Dawson, female    DOB: 16-Dec-1965, 58 y.o.   MRN: [id number]  This visit was conducted in person.  BP 118/80   Pulse 65   Temp 98.1 F (36.7 C) (Oral)   Ht 5' 3 (1.6 m)   Wt 125 lb 4 oz (56.8 kg)   SpO2 94%   BMI 22.19 kg/m    CC:  Chief Complaint  Patient presents with  . Annual Exam    Subjective:   HPI: Stephanie Dawson is a 58 y.o. female presenting on 02/07/2023 for Annual Exam  The patient presents for annual  wellness, complete physical and review of chronic health problems. He/She also has the following concerns: none  She continues to have intermittent issues with  debarkment syndrome' since being on cruise ship. She is getting more used to it, fairly manageable.  Elevated Cholesterol:  Poorly controlled. LDL  high. Had been using the red yeast, has now stopped.  She is concerned about dementia associated with statins possibly.   Lab Results  Component Value Date   CHOL 290 (H) 01/31/2023   HDL 86.80 01/31/2023   LDLCALC 187 (H) 01/31/2023   LDLDIRECT 191.3 08/27/2012   TRIG 77.0 01/31/2023   CHOLHDL 3 01/31/2023  The 10-year ASCVD risk score (Arnett DK, et al., 2019) is: 2.2%   Values used to calculate the score:     Age: 12 years     Sex: Female     Is Non-Hispanic African American: No     Diabetic: No     Tobacco smoker: No     Systolic Blood Pressure: 118 mmHg     Is BP treated: No     HDL Cholesterol: 86.8 mg/dL     Total Cholesterol: 290 mg/dL   Diet compliance:none Exercise: 2 times weeks Other complaints:  Atrophic vaginitis, early menopause  Yuvafem  10 mcg vaginal tablet twice  a week... using good RX.       Relevant past medical, surgical, family and social history reviewed and updated as indicated. Interim medical history since our last visit reviewed. Allergies and medications reviewed and updated. Outpatient Medications Prior to Visit  Medication Sig Dispense Refill  . Calcium Carbonate-Vit  D-Min 600-400 MG-UNIT TABS Take by mouth. Take one tablet by mouth two times daily     . YUVAFEM  10 MCG TABS vaginal tablet INSERT 1 TAB VAGINALLY TWICE A WEEK 24 tablet 3  . Multiple Vitamins-Calcium (ONE-A-DAY WOMENS FORMULA) TABS Take 1 tablet by mouth daily.     No facility-administered medications prior to visit.     Per HPI unless specifically indicated in ROS section below Review of Systems  Constitutional:  Negative for fatigue and fever.  HENT:  Negative for congestion.   Eyes:  Negative for pain.  Respiratory:  Negative for cough and shortness of breath.   Cardiovascular:  Negative for chest pain, palpitations and leg swelling.  Gastrointestinal:  Negative for abdominal pain.  Genitourinary:  Negative for dysuria and vaginal bleeding.  Musculoskeletal:  Negative for back pain.  Neurological:  Negative for syncope, light-headedness and headaches.  Psychiatric/Behavioral:  Negative for dysphoric mood.    Objective:  BP 118/80   Pulse 65   Temp 98.1 F (36.7 C) (Oral)   Ht 5' 3 (1.6 m)   Wt 125 lb 4 oz (56.8 kg)   SpO2 94%   BMI 22.19 kg/m   Wt Readings from Last 3 Encounters:  02/07/23 125 lb 4 oz (56.8  kg)  01/31/22 126 lb 12.8 oz (57.5 kg)  11/17/21 122 lb 6 oz (55.5 kg)      Physical Exam Vitals and nursing note reviewed. Exam conducted with a chaperone present.  Constitutional:      General: She is not in acute distress.    Appearance: Normal appearance. She is well-developed. She is not ill-appearing or toxic-appearing.  HENT:     Head: Normocephalic.     Right Ear: Hearing, tympanic membrane, ear canal and external ear normal.     Left Ear: Hearing, tympanic membrane, ear canal and external ear normal.     Nose: Nose normal.  Eyes:     General: Lids are normal. Lids are everted, no foreign bodies appreciated.     Conjunctiva/sclera: Conjunctivae normal.     Pupils: Pupils are equal, round, and reactive to light.  Neck:     Thyroid: No thyroid mass or  thyromegaly.     Vascular: No carotid bruit.     Trachea: Trachea normal.  Cardiovascular:     Rate and Rhythm: Normal rate and regular rhythm.     Heart sounds: Normal heart sounds, S1 normal and S2 normal. No murmur heard.    No gallop.  Pulmonary:     Effort: Pulmonary effort is normal. No respiratory distress.     Breath sounds: Normal breath sounds. No wheezing, rhonchi or rales.  Abdominal:     General: Bowel sounds are normal. There is no distension or abdominal bruit.     Palpations: Abdomen is soft. There is no fluid wave or mass.     Tenderness: There is no abdominal tenderness. There is no guarding or rebound.     Hernia: No hernia is present. There is no hernia in the left inguinal area or right inguinal area.  Genitourinary:    Exam position: Supine.     Pubic Area: No rash.      Labia:        Right: No rash, tenderness, lesion or injury.        Left: No rash, tenderness or lesion.      Urethra: No urethral pain or urethral lesion.     Vagina: Normal.     Cervix: Normal.     Uterus: Normal.      Adnexa: Right adnexa normal and left adnexa normal.  Musculoskeletal:     Cervical back: Normal range of motion and neck supple.  Lymphadenopathy:     Cervical: No cervical adenopathy.     Lower Body: No right inguinal adenopathy. No left inguinal adenopathy.  Skin:    General: Skin is warm and dry.     Findings: No rash.  Neurological:     Mental Status: She is alert.     Cranial Nerves: No cranial nerve deficit.     Sensory: No sensory deficit.  Psychiatric:        Mood and Affect: Mood is not anxious or depressed.        Speech: Speech normal.        Behavior: Behavior normal. Behavior is cooperative.        Judgment: Judgment normal.      Results for orders placed or performed in visit on 01/31/23  Lipid panel   Collection Time: 01/31/23  8:03 AM  Result Value Ref Range   Cholesterol 290 (H) 0 - 200 mg/dL   Triglycerides 22.9 0.0 - 149.0 mg/dL   HDL 13.19  >60.99 mg/dL   VLDL 84.5 0.0 - 40.0  mg/dL   LDL Cholesterol 812 (H) 0 - 99 mg/dL   Total CHOL/HDL Ratio 3    NonHDL 202.79   Comprehensive metabolic panel   Collection Time: 01/31/23  8:03 AM  Result Value Ref Range   Sodium 141 135 - 145 mEq/L   Potassium 4.1 3.5 - 5.1 mEq/L   Chloride 101 96 - 112 mEq/L   CO2 28 19 - 32 mEq/L   Glucose, Bld 84 70 - 99 mg/dL   BUN 18 6 - 23 mg/dL   Creatinine, Ser 9.20 0.40 - 1.20 mg/dL   Total Bilirubin 0.6 0.2 - 1.2 mg/dL   Alkaline Phosphatase 37 (L) 39 - 117 U/L   AST 17 0 - 37 U/L   ALT 12 0 - 35 U/L   Total Protein 7.2 6.0 - 8.3 g/dL   Albumin 4.9 3.5 - 5.2 g/dL   GFR 17.30 >39.99 mL/min   Calcium 9.9 8.4 - 10.5 mg/dL     COVID 19 screen:  No recent travel or known exposure to COVID19 The patient denies respiratory symptoms of COVID 19 at this time. The importance of social distancing was discussed today.   Assessment and Plan   The patient's preventative maintenance and recommended screening tests for an annual wellness exam were reviewed in full today. Brought up to date unless services declined.  Counselled on the importance of diet, exercise, and its role in overall health and mortality. The patient's FH and SH was reviewed, including their home life, tobacco status, and drug and alcohol status.   PAP/HPV 2019 nml, on q5 year schedule,  No family history of ovarian cancer and asymptomatic.SABRA DEXA: osteopenia stable 10/2018. No longer on fosamax >5 years, stopped 2017. DUE 10/2023 Mammogram, nml 02/01/2022 Vaccines: Up to date with Td. Refused flu. Uptodate with COVID19 series. Consider shingrix HIV: refused  Colonoscopy:  No family cancer. Continue yearly  IFOB. Last 12/2020 negative, repeat due  Problem List Items Addressed This Visit     HYPERCHOLESTEROLEMIA   , LDL elevated but no other risk factors. She is not interested in using red yeast rice given possible association with dementia, etc. Discussed in detail. Did  briefly discuss calcium CT score for consideration. She will continue working on heart healthy diet, regular exercise and weight maintenance. Reevaluate in 1 year.      Other Visit Diagnoses       Routine general medical examination at a health care facility    -  Primary     Colon cancer screening       Relevant Orders   Cologuard        Greig Ring, MD

## 2023-02-07 NOTE — Assessment & Plan Note (Signed)
,   LDL elevated but no other risk factors. She is not interested in using red yeast rice given possible association with dementia, etc. Discussed in detail. Did briefly discuss calcium CT score for consideration. She will continue working on heart healthy diet, regular exercise and weight maintenance. Reevaluate in 1 year.

## 2023-02-11 LAB — CYTOLOGY - PAP
Comment: NEGATIVE
Diagnosis: NEGATIVE
High risk HPV: NEGATIVE

## 2023-02-21 DIAGNOSIS — Z1211 Encounter for screening for malignant neoplasm of colon: Secondary | ICD-10-CM | POA: Diagnosis not present

## 2023-02-23 ENCOUNTER — Other Ambulatory Visit: Payer: Self-pay | Admitting: Family Medicine

## 2023-02-28 LAB — COLOGUARD: COLOGUARD: NEGATIVE

## 2023-03-01 ENCOUNTER — Encounter: Payer: Self-pay | Admitting: Family Medicine

## 2023-03-06 ENCOUNTER — Ambulatory Visit
Admission: RE | Admit: 2023-03-06 | Discharge: 2023-03-06 | Disposition: A | Discharge status: Home / Self Care | Payer: BC Managed Care – PPO | Source: Ambulatory Visit | Attending: Family Medicine | Admitting: Family Medicine

## 2023-03-06 DIAGNOSIS — Z1231 Encounter for screening mammogram for malignant neoplasm of breast: Secondary | ICD-10-CM | POA: Diagnosis not present

## 2023-05-21 ENCOUNTER — Ambulatory Visit: Admitting: Family Medicine

## 2023-05-21 ENCOUNTER — Encounter: Payer: Self-pay | Admitting: Family Medicine

## 2023-05-21 ENCOUNTER — Ambulatory Visit (INDEPENDENT_AMBULATORY_CARE_PROVIDER_SITE_OTHER): Admitting: Family Medicine

## 2023-05-21 VITALS — BP 100/80 | HR 69 | Temp 97.8°F | Ht 63.0 in | Wt 121.2 lb

## 2023-05-21 DIAGNOSIS — H00022 Hordeolum internum right lower eyelid: Secondary | ICD-10-CM | POA: Diagnosis not present

## 2023-05-21 DIAGNOSIS — H5789 Other specified disorders of eye and adnexa: Secondary | ICD-10-CM

## 2023-05-21 MED ORDER — NEOMYCIN-POLYMYXIN-DEXAMETH 3.5-10000-0.1 OP SUSP: 2.0000 [drp] | Freq: Four times a day (QID) | OPHTHALMIC | 0 refills | Status: AC

## 2023-05-21 MED ORDER — OLOPATADINE HCL 0.1 % OP SOLN: 1.0000 [drp] | Freq: Two times a day (BID) | OPHTHALMIC | 12 refills | Status: AC

## 2023-05-21 NOTE — Progress Notes (Signed)
 Patient ID: Stephanie Dawson, female    DOB: [birth year], 58 y.o.   MRN: [id number]  This visit was conducted in person.  BP 100/80 (BP Location: Left Arm, Patient Position: Sitting, Cuff Size: Normal)   Pulse 69   Temp 97.8 F (36.6 C) (Temporal)   Ht 5\' 3"  (1.6 m)   Wt 121 lb 4 oz (55 kg)   SpO2 97%   BMI 21.48 kg/m    CC:  Chief Complaint  Patient presents with   Eye Problem    Right-Grainy Feeling/Red and bump on bottom eye lid    Subjective:   HPI: Stephanie Dawson is a 58 y.o. female presenting on 05/21/2023 for Eye Problem (Right-Grainy Feeling/Red and bump on bottom eye lid)    12/2022 Right eye dry and itchy, conjunctival hemorrhage. Given eye antihistamine x 10 days   Since then Has had recurrent spells  Of  itchy, grainy feeling and redness in bilateral eyes, worse in  her right eye.  She is also noted a bump on her bottom eyelid on inside... saw today  Now in last week symptoms have returned.  No sneeze, no runny nose   No associated eye discharge.  Wears glasses's.   No fever.     Relevant past medical, surgical, family and social history reviewed and updated as indicated. Interim medical history since our last visit reviewed. Allergies and medications reviewed and updated. Outpatient Medications Prior to Visit  Medication Sig Dispense Refill   VITAMIN D  PO Take 1 tablet by mouth daily.     YUVAFEM  10 MCG TABS vaginal tablet INSERT 1 TAB VAGINALLY TWICE A WEEK 24 tablet 3   Calcium Carbonate-Vit D-Min 600-400 MG-UNIT TABS Take by mouth. Take one tablet by mouth two times daily      No facility-administered medications prior to visit.     Per HPI unless specifically indicated in ROS section below Review of Systems  Constitutional:  Negative for fatigue and fever.  HENT:  Negative for ear pain.   Eyes:  Positive for pain, redness and itching.  Respiratory:  Negative for chest tightness and shortness of breath.   Cardiovascular:  Negative  for chest pain, palpitations and leg swelling.  Gastrointestinal:  Negative for abdominal pain.  Genitourinary:  Negative for dysuria.   Objective:  BP 100/80 (BP Location: Left Arm, Patient Position: Sitting, Cuff Size: Normal)   Pulse 69   Temp 97.8 F (36.6 C) (Temporal)   Ht 5\' 3"  (1.6 m)   Wt 121 lb 4 oz (55 kg)   SpO2 97%   BMI 21.48 kg/m   Wt Readings from Last 3 Encounters:  05/21/23 121 lb 4 oz (55 kg)  02/07/23 125 lb 4 oz (56.8 kg)  01/31/22 126 lb 12.8 oz (57.5 kg)      Physical Exam Constitutional:      General: She is not in acute distress.    Appearance: Normal appearance. She is well-developed. She is not ill-appearing or toxic-appearing.  HENT:     Head: Normocephalic.     Right Ear: Hearing, tympanic membrane, ear canal and external ear normal. Tympanic membrane is not erythematous, retracted or bulging.     Left Ear: Hearing, tympanic membrane, ear canal and external ear normal. Tympanic membrane is not erythematous, retracted or bulging.     Nose: No mucosal edema or rhinorrhea.     Right Sinus: No maxillary sinus tenderness or frontal sinus tenderness.     Left Sinus: No  maxillary sinus tenderness or frontal sinus tenderness.     Mouth/Throat:     Pharynx: Uvula midline.  Eyes:     General: Lids are normal. Lids are everted, no foreign bodies appreciated. Allergic shiner present. No visual field deficit or scleral icterus.       Right eye: Hordeolum present. No foreign body or discharge.        Left eye: No foreign body, discharge or hordeolum.     Extraocular Movements:     Right eye: Normal extraocular motion and no nystagmus.     Left eye: Normal extraocular motion and no nystagmus.     Conjunctiva/sclera:     Right eye: Right conjunctiva is injected. No chemosis, exudate or hemorrhage.    Left eye: Left conjunctiva is injected. No chemosis, exudate or hemorrhage.    Pupils: Pupils are equal, round, and reactive to light.  Neck:     Thyroid: No  thyroid mass or thyromegaly.     Vascular: No carotid bruit.     Trachea: Trachea normal.  Cardiovascular:     Rate and Rhythm: Normal rate and regular rhythm.     Pulses: Normal pulses.     Heart sounds: Normal heart sounds, S1 normal and S2 normal. No murmur heard.    No friction rub. No gallop.  Pulmonary:     Effort: Pulmonary effort is normal. No tachypnea or respiratory distress.     Breath sounds: Normal breath sounds. No decreased breath sounds, wheezing, rhonchi or rales.  Abdominal:     General: Bowel sounds are normal.     Palpations: Abdomen is soft.     Tenderness: There is no abdominal tenderness.  Musculoskeletal:     Cervical back: Normal range of motion and neck supple.  Skin:    General: Skin is warm and dry.     Findings: No rash.  Neurological:     Mental Status: She is alert.  Psychiatric:        Mood and Affect: Mood is not anxious or depressed.        Speech: Speech normal.        Behavior: Behavior normal. Behavior is cooperative.        Thought Content: Thought content normal.        Judgment: Judgment normal.       Results for orders placed or performed in visit on 02/07/23  Cytology - PAP   Collection Time: 02/07/23  5:03 PM  Result Value Ref Range   High risk HPV Negative    Adequacy      Satisfactory for evaluation; transformation zone component PRESENT.   Diagnosis      - Negative for intraepithelial lesion or malignancy (NILM)   Comment Normal Reference Range HPV - Negative   Cologuard   Collection Time: 02/21/23  9:20 AM  Result Value Ref Range   COLOGUARD Negative Negative    Assessment and Plan  Redness of eye, right  Hordeolum internum of right lower eyelid  Other orders -     Olopatadine  HCl; Place 1 drop into both eyes 2 (two) times daily.  Dispense: 5 mL; Refill: 12 -     Neomycin -Polymyxin-Dexameth; Place 2 drops into the right eye every 6 (six) hours for 5 days.  Dispense: 5 mL; Refill: 0   Start with treatment of  hordeolum on right lower lid... treat with antibiotic drops x 5 days and warm compressess 2-3 times daily.  Then start topical antihistamine drops for eye allergies  as needed twice daily over the spring  until symptoms resolved  If hordeolum remains sore or increasing in size .Aaron Aas call for erythromycin  ointment.  No follow-ups on file.   Herby Lolling, MD

## 2023-05-21 NOTE — Patient Instructions (Signed)
 Start with treatment of hordeolum on right lower lid... treat with antibiotic drops x 5 days and warm compressess 2-3 times daily.  Then start topical antihistamine drops for eye allergies as needed twice daily over the spring  until symptoms resolved  If hordeolum remains sore or increasing in size .Stephanie Dawson call for erythromycin  ointment.

## 2023-05-23 ENCOUNTER — Telehealth: Payer: Self-pay | Admitting: *Deleted

## 2023-05-23 MED ORDER — ERYTHROMYCIN 5 MG/GM OP OINT: 1.0000 | TOPICAL_OINTMENT | Freq: Every day | OPHTHALMIC | 0 refills | Status: AC

## 2023-05-23 NOTE — Telephone Encounter (Signed)
 Ramonica notified as instructed by telephone.  Patient states understanding and is very Adult nurse.

## 2023-05-23 NOTE — Telephone Encounter (Signed)
 Copied from CRM [id number]. Topic: Clinical - Medication Question >> May 23, 2023  9:07 AM Dorthula Gavel H wrote: Reason for CRM: pt called in wanting to know if her provider can call in the other antibiotic they discussed at last appointment for her eye, if the current one isn't working. She states she would now like to try it to see if it will help her eye.

## 2023-05-23 NOTE — Telephone Encounter (Signed)
 Let patient know I have sent in erythromycin  ointment for her to apply to the lower lid. She can also start the Patanol prescription I sent in for allergy symptoms twice daily.

## 2023-05-23 NOTE — Addendum Note (Signed)
 Addended by: Herby Lolling E on: 05/23/2023 02:43 PM   Modules accepted: Orders

## 2024-01-25 ENCOUNTER — Other Ambulatory Visit: Payer: Self-pay | Admitting: Family Medicine

## 2024-01-27 ENCOUNTER — Other Ambulatory Visit: Payer: Self-pay | Admitting: Family Medicine

## 2024-01-27 DIAGNOSIS — Z1231 Encounter for screening mammogram for malignant neoplasm of breast: Secondary | ICD-10-CM

## 2024-01-27 NOTE — Telephone Encounter (Signed)
 Please schedule CPE with fasting labs prior with Dr. Avelina after 02/07/2024.

## 2024-01-27 NOTE — Telephone Encounter (Signed)
 I called and left a voicemail for pt to be scheduled.

## 2024-01-29 ENCOUNTER — Telehealth: Payer: Self-pay | Admitting: *Deleted

## 2024-01-29 DIAGNOSIS — E7800 Pure hypercholesterolemia, unspecified: Secondary | ICD-10-CM

## 2024-01-29 NOTE — Telephone Encounter (Signed)
-----   Message from Veva Stephanie Dawson sent at 01/28/2024  3:01 PM EST ----- Regarding: Lab orders for Tue, 1.5.26 Patient is scheduled for CPX labs, please order future labs, Thanks , Veva

## 2024-02-04 ENCOUNTER — Ambulatory Visit: Payer: Self-pay | Admitting: Family Medicine

## 2024-02-04 ENCOUNTER — Other Ambulatory Visit (INDEPENDENT_AMBULATORY_CARE_PROVIDER_SITE_OTHER)

## 2024-02-04 DIAGNOSIS — E7800 Pure hypercholesterolemia, unspecified: Secondary | ICD-10-CM

## 2024-02-04 LAB — LIPID PANEL
Cholesterol: 292 mg/dL — ABNORMAL HIGH (ref 28–200)
HDL: 73 mg/dL
LDL Cholesterol: 189 mg/dL — ABNORMAL HIGH (ref 10–99)
NonHDL: 219.22
Total CHOL/HDL Ratio: 4
Triglycerides: 150 mg/dL — ABNORMAL HIGH (ref 10.0–149.0)
VLDL: 30 mg/dL (ref 0.0–40.0)

## 2024-02-04 LAB — COMPREHENSIVE METABOLIC PANEL WITH GFR
ALT: 15 U/L (ref 3–35)
AST: 19 U/L (ref 5–37)
Albumin: 4.7 g/dL (ref 3.5–5.2)
Alkaline Phosphatase: 40 U/L (ref 39–117)
BUN: 18 mg/dL (ref 6–23)
CO2: 31 meq/L (ref 19–32)
Calcium: 9.5 mg/dL (ref 8.4–10.5)
Chloride: 103 meq/L (ref 96–112)
Creatinine, Ser: 0.79 mg/dL (ref 0.40–1.20)
GFR: 82.1 mL/min
Glucose, Bld: 84 mg/dL (ref 70–99)
Potassium: 4.2 meq/L (ref 3.5–5.1)
Sodium: 139 meq/L (ref 135–145)
Total Bilirubin: 0.4 mg/dL (ref 0.2–1.2)
Total Protein: 7.1 g/dL (ref 6.0–8.3)

## 2024-02-04 NOTE — Telephone Encounter (Unsigned)
 Copied from CRM [id number]. Topic: Clinical - Request for Lab/Test Order >> Feb 04, 2024  8:40 AM Antwanette L wrote: Reason for CRM:   Patient wants confirmation that correct amount of blood was drawn and correct labs were ordered. Patient had blood test this morning and expressed concern due to past issues. Requested follow-up to verify accuracy.Patient can be reached via mychart

## 2024-02-04 NOTE — Progress Notes (Signed)
 No critical labs need to be addressed urgently. We will discuss labs in detail at upcoming office visit.

## 2024-02-11 ENCOUNTER — Encounter: Payer: Self-pay | Admitting: Family Medicine

## 2024-02-11 ENCOUNTER — Ambulatory Visit (INDEPENDENT_AMBULATORY_CARE_PROVIDER_SITE_OTHER): Admitting: Family Medicine

## 2024-02-11 VITALS — BP 120/82 | HR 65 | Temp 98.4°F | Ht 63.25 in | Wt 123.1 lb

## 2024-02-11 DIAGNOSIS — M8580 Other specified disorders of bone density and structure, unspecified site: Secondary | ICD-10-CM

## 2024-02-11 DIAGNOSIS — Z0000 Encounter for general adult medical examination without abnormal findings: Secondary | ICD-10-CM

## 2024-02-11 DIAGNOSIS — E7800 Pure hypercholesterolemia, unspecified: Secondary | ICD-10-CM

## 2024-02-11 NOTE — Assessment & Plan Note (Signed)
 Chronic, LDL elevated but no other risk factors. She is not interested in using red yeast rice given possible association with dementia, etc. Discussed in detail. Did briefly discuss calcium CT score for consideration.  Will consider adding lipoprotein A. She will continue working on heart healthy diet, regular exercise and weight maintenance. Reevaluate in 1 year.

## 2024-02-11 NOTE — Assessment & Plan Note (Signed)
 Repeat eval due off of fosamax

## 2024-02-11 NOTE — Progress Notes (Signed)
 "   Patient ID: Stephanie Dawson, female    DOB: [birth year], 59 y.o.   MRN: [id number]  This visit was conducted in person.  BP 120/82   Pulse 65   Temp 98.4 F (36.9 C) (Temporal)   Ht 5' 3.25 (1.607 m)   Wt 123 lb 2 oz (55.8 kg)   SpO2 99%   BMI 21.64 kg/m    CC:  Chief Complaint  Patient presents with   Annual Exam    Subjective:   HPI: Stephanie Dawson is a 59 y.o. female presenting on 02/11/2024 for Annual Exam  The patient presents for annual  wellness, complete physical and review of chronic health problems. He/She also has the following concerns: none   Elevated Cholesterol:  Poorly controlled. LDL  high. Had been using the red yeast, has now stopped.  She is concerned about dementia associated with statins possibly.   Lab Results  Component Value Date   CHOL 292 (H) 02/04/2024   HDL 73.00 02/04/2024   LDLCALC 189 (H) 02/04/2024   LDLDIRECT 191.3 08/27/2012   TRIG 150.0 (H) 02/04/2024   CHOLHDL 4 02/04/2024  The 10-year ASCVD risk score (Arnett DK, et al., 2019) is: 2.9%   Values used to calculate the score:     Age: 47 years     Clinically relevant sex: Female     Is Non-Hispanic African American: No     Diabetic: No     Tobacco smoker: No     Systolic Blood Pressure: 120 mmHg     Is BP treated: No     HDL Cholesterol: 73 mg/dL     Total Cholesterol: 292 mg/dL   Diet compliance: poor  over holidays Exercise: 4 times weeks Other complaints:  Atrophic vaginitis, early menopause  Yuvafem  10 mcg vaginal tablet twice  a week... using good RX.       Relevant past medical, surgical, family and social history reviewed and updated as indicated. Interim medical history since our last visit reviewed. Allergies and medications reviewed and updated. Outpatient Medications Prior to Visit  Medication Sig Dispense Refill   olopatadine  (PATANOL) 0.1 % ophthalmic solution Place 1 drop into both eyes 2 (two) times daily. 5 mL 12   VITAMIN D  PO Take 1  tablet by mouth daily.     YUVAFEM  10 MCG TABS vaginal tablet INSERT 1 TAB VAGINALLY TWICE A WEEK 24 tablet 0   No facility-administered medications prior to visit.     Per HPI unless specifically indicated in ROS section below Review of Systems  Constitutional:  Negative for fatigue and fever.  HENT:  Negative for congestion.   Eyes:  Negative for pain.  Respiratory:  Negative for cough and shortness of breath.   Cardiovascular:  Negative for chest pain, palpitations and leg swelling.  Gastrointestinal:  Negative for abdominal pain.  Genitourinary:  Negative for dysuria and vaginal bleeding.  Musculoskeletal:  Negative for back pain.  Neurological:  Negative for syncope, light-headedness and headaches.  Psychiatric/Behavioral:  Negative for dysphoric mood.    Objective:  BP 120/82   Pulse 65   Temp 98.4 F (36.9 C) (Temporal)   Ht 5' 3.25 (1.607 m)   Wt 123 lb 2 oz (55.8 kg)   SpO2 99%   BMI 21.64 kg/m   Wt Readings from Last 3 Encounters:  02/11/24 123 lb 2 oz (55.8 kg)  05/21/23 121 lb 4 oz (55 kg)  02/07/23 125 lb 4 oz (56.8 kg)  Physical Exam Vitals and nursing note reviewed. Exam conducted with a chaperone present.  Constitutional:      General: She is not in acute distress.    Appearance: Normal appearance. She is well-developed. She is not ill-appearing or toxic-appearing.  HENT:     Head: Normocephalic.     Right Ear: Hearing, tympanic membrane, ear canal and external ear normal.     Left Ear: Hearing, tympanic membrane, ear canal and external ear normal.     Nose: Nose normal.  Eyes:     General: Lids are normal. Lids are everted, no foreign bodies appreciated.     Conjunctiva/sclera: Conjunctivae normal.     Pupils: Pupils are equal, round, and reactive to light.  Neck:     Thyroid: No thyroid mass or thyromegaly.     Vascular: No carotid bruit.     Trachea: Trachea normal.  Cardiovascular:     Rate and Rhythm: Normal rate and regular rhythm.      Heart sounds: Normal heart sounds, S1 normal and S2 normal. No murmur heard.    No gallop.  Pulmonary:     Effort: Pulmonary effort is normal. No respiratory distress.     Breath sounds: Normal breath sounds. No wheezing, rhonchi or rales.  Abdominal:     General: Bowel sounds are normal. There is no distension or abdominal bruit.     Palpations: Abdomen is soft. There is no fluid wave or mass.     Tenderness: There is no abdominal tenderness. There is no guarding or rebound.     Hernia: No hernia is present. There is no hernia in the left inguinal area or right inguinal area.  Genitourinary:    Exam position: Supine.     Pubic Area: No rash.      Labia:        Right: No rash, tenderness, lesion or injury.        Left: No rash, tenderness or lesion.      Urethra: No urethral pain or urethral lesion.     Vagina: Normal.     Cervix: Normal.     Uterus: Normal.      Adnexa: Right adnexa normal and left adnexa normal.  Musculoskeletal:     Cervical back: Normal range of motion and neck supple.  Lymphadenopathy:     Cervical: No cervical adenopathy.     Lower Body: No right inguinal adenopathy. No left inguinal adenopathy.  Skin:    General: Skin is warm and dry.     Findings: No rash.  Neurological:     Mental Status: She is alert.     Cranial Nerves: No cranial nerve deficit.     Sensory: No sensory deficit.  Psychiatric:        Mood and Affect: Mood is not anxious or depressed.        Speech: Speech normal.        Behavior: Behavior normal. Behavior is cooperative.        Judgment: Judgment normal.       Results for orders placed or performed in visit on 02/04/24  Lipid panel   Collection Time: 02/04/24  7:33 AM  Result Value Ref Range   Cholesterol 292 (H) 28 - 200 mg/dL   Triglycerides 849.9 (H) 10.0 - 149.0 mg/dL   HDL 26.99 >60.99 mg/dL   VLDL 69.9 0.0 - 59.9 mg/dL   LDL Cholesterol 810 (H) 10 - 99 mg/dL   Total CHOL/HDL Ratio 4    NonHDL  219.22   Comprehensive  metabolic panel   Collection Time: 02/04/24  7:33 AM  Result Value Ref Range   Sodium 139 135 - 145 mEq/L   Potassium 4.2 3.5 - 5.1 mEq/L   Chloride 103 96 - 112 mEq/L   CO2 31 19 - 32 mEq/L   Glucose, Bld 84 70 - 99 mg/dL   BUN 18 6 - 23 mg/dL   Creatinine, Ser 9.20 0.40 - 1.20 mg/dL   Total Bilirubin 0.4 0.2 - 1.2 mg/dL   Alkaline Phosphatase 40 39 - 117 U/L   AST 19 5 - 37 U/L   ALT 15 3 - 35 U/L   Total Protein 7.1 6.0 - 8.3 g/dL   Albumin 4.7 3.5 - 5.2 g/dL   GFR 17.89 >39.99 mL/min   Calcium 9.5 8.4 - 10.5 mg/dL     COVID 19 screen:  No recent travel or known exposure to COVID19 The patient denies respiratory symptoms of COVID 19 at this time. The importance of social distancing was discussed today.   Assessment and Plan   The patient's preventative maintenance and recommended screening tests for an annual wellness exam were reviewed in full today. Brought up to date unless services declined.  Counselled on the importance of diet, exercise, and its role in overall health and mortality. The patient's FH and SH was reviewed, including their home life, tobacco status, and drug and alcohol status.   PAP/HPV 02/07/2023 nml, on q5 year schedule,  No family history of ovarian cancer and asymptomatic.SABRA DEXA: osteopenia stable 10/2018. No longer on fosamax >5 years, stopped 2017. DUE 10/2023 Mammogram, nml 03/2023 Vaccines: Up to date with Td. Refused flu. Uptodate with COVID19 series. Consider shingrix, prevnar 20 HIV: refused  Colonoscopy:  No family cancer. Continue yearly  IFOB. Last 12/2020 negative,  Cologuard neg 01/2023, repeat in 2028 HIV refused.  Problem List Items Addressed This Visit     HYPERCHOLESTEROLEMIA   Chronic, LDL elevated but no other risk factors. She is not interested in using red yeast rice given possible association with dementia, etc. Discussed in detail. Did briefly discuss calcium CT score for consideration.  Will consider adding lipoprotein  A. She will continue working on heart healthy diet, regular exercise and weight maintenance. Reevaluate in 1 year.      Osteopenia   Repeat eval due off of fosamax       Other Visit Diagnoses       Routine general medical examination at a health care facility    -  Primary         Greig Ring, MD   "

## 2024-02-11 NOTE — Patient Instructions (Signed)
 " Call when ready schedule Bone Density .  [facility]   [facility]                      Phone:  [phone] [address]                               [place], [place] [address]                                                             Services: Traditional and 3D Mammogram, Bone Density   [facility]                 Phone: [phone] [address]                                                       [place], [place] [address]    Service: Bone Density ONLY   *this site does NOT perform mammograms  [facility]                        Phone:  [phone] [address]                                  [place], [place] [address]                                            Services:  3D Mammogram and Bone Density    [facility]   Phone:  [phone]   [address]                                                                            [place], [place] [address]                                            Services: 3D Mammogram and Bone [facility])  Phone:  [phone]   [address]                        [place], [place] [address]  Services:  3D Mammogram and Bone Density  . "

## 2024-03-06 ENCOUNTER — Ambulatory Visit

## 2024-03-13 ENCOUNTER — Ambulatory Visit
